# Patient Record
Sex: Female | Born: 1940
Health system: Southern US, Community
[De-identification: ages and names within clinical notes are randomized; demographics above are authoritative.]

## PROBLEM LIST (undated history)

## (undated) DIAGNOSIS — I951 Orthostatic hypotension: Secondary | ICD-10-CM

## (undated) DIAGNOSIS — I10 Essential (primary) hypertension: Secondary | ICD-10-CM

## (undated) DIAGNOSIS — E785 Hyperlipidemia, unspecified: Secondary | ICD-10-CM

## (undated) HISTORY — PX: APPENDECTOMY: SHX54

## (undated) HISTORY — PX: LEG AMPUTATION: SHX1105

## (undated) HISTORY — PX: CHOLECYSTECTOMY: SHX55

## (undated) HISTORY — DX: Orthostatic hypotension: I95.1

## (undated) HISTORY — PX: WISDOM TOOTH EXTRACTION: SHX21

## (undated) HISTORY — DX: Essential (primary) hypertension: I10

## (undated) HISTORY — PX: TONSILLECTOMY: SUR1361

## (undated) HISTORY — DX: Hyperlipidemia, unspecified: E78.5

---

## 2001-10-22 ENCOUNTER — Other Ambulatory Visit: Admission: RE | Admit: 2001-10-22 | Discharge: 2001-10-22 | Payer: Self-pay | Admitting: Family Medicine

## 2004-07-12 ENCOUNTER — Ambulatory Visit: Payer: Self-pay | Admitting: Family Medicine

## 2004-10-24 ENCOUNTER — Ambulatory Visit: Payer: Self-pay | Admitting: Family Medicine

## 2006-04-04 ENCOUNTER — Encounter: Admission: RE | Admit: 2006-04-04 | Discharge: 2006-04-04 | Payer: Self-pay | Admitting: Family Medicine

## 2008-05-22 HISTORY — PX: JOINT REPLACEMENT: SHX530

## 2014-07-01 DIAGNOSIS — F320 Major depressive disorder, single episode, mild: Secondary | ICD-10-CM | POA: Diagnosis not present

## 2014-08-20 DIAGNOSIS — E780 Pure hypercholesterolemia: Secondary | ICD-10-CM | POA: Diagnosis not present

## 2014-08-20 DIAGNOSIS — F320 Major depressive disorder, single episode, mild: Secondary | ICD-10-CM | POA: Diagnosis not present

## 2014-08-20 DIAGNOSIS — I10 Essential (primary) hypertension: Secondary | ICD-10-CM | POA: Diagnosis not present

## 2014-11-20 DIAGNOSIS — I10 Essential (primary) hypertension: Secondary | ICD-10-CM | POA: Diagnosis not present

## 2014-11-20 DIAGNOSIS — E780 Pure hypercholesterolemia: Secondary | ICD-10-CM | POA: Diagnosis not present

## 2014-11-20 DIAGNOSIS — Z79899 Other long term (current) drug therapy: Secondary | ICD-10-CM | POA: Diagnosis not present

## 2015-01-21 DIAGNOSIS — Z23 Encounter for immunization: Secondary | ICD-10-CM | POA: Diagnosis not present

## 2015-01-21 DIAGNOSIS — I10 Essential (primary) hypertension: Secondary | ICD-10-CM | POA: Diagnosis not present

## 2015-01-21 DIAGNOSIS — E780 Pure hypercholesterolemia: Secondary | ICD-10-CM | POA: Diagnosis not present

## 2015-03-02 DIAGNOSIS — Z23 Encounter for immunization: Secondary | ICD-10-CM | POA: Diagnosis not present

## 2015-03-26 DIAGNOSIS — Z1231 Encounter for screening mammogram for malignant neoplasm of breast: Secondary | ICD-10-CM | POA: Diagnosis not present

## 2015-03-30 DIAGNOSIS — H2513 Age-related nuclear cataract, bilateral: Secondary | ICD-10-CM | POA: Diagnosis not present

## 2015-06-17 DIAGNOSIS — R419 Unspecified symptoms and signs involving cognitive functions and awareness: Secondary | ICD-10-CM | POA: Diagnosis not present

## 2015-08-19 DIAGNOSIS — K219 Gastro-esophageal reflux disease without esophagitis: Secondary | ICD-10-CM | POA: Diagnosis not present

## 2015-08-19 DIAGNOSIS — G3184 Mild cognitive impairment, so stated: Secondary | ICD-10-CM | POA: Diagnosis not present

## 2015-08-19 DIAGNOSIS — I10 Essential (primary) hypertension: Secondary | ICD-10-CM | POA: Diagnosis not present

## 2015-08-19 DIAGNOSIS — E7800 Pure hypercholesterolemia, unspecified: Secondary | ICD-10-CM | POA: Diagnosis not present

## 2015-08-19 DIAGNOSIS — Z79899 Other long term (current) drug therapy: Secondary | ICD-10-CM | POA: Diagnosis not present

## 2015-11-25 DIAGNOSIS — F320 Major depressive disorder, single episode, mild: Secondary | ICD-10-CM | POA: Diagnosis not present

## 2015-11-25 DIAGNOSIS — G3184 Mild cognitive impairment, so stated: Secondary | ICD-10-CM | POA: Diagnosis not present

## 2015-11-25 DIAGNOSIS — E7800 Pure hypercholesterolemia, unspecified: Secondary | ICD-10-CM | POA: Diagnosis not present

## 2015-11-25 DIAGNOSIS — I10 Essential (primary) hypertension: Secondary | ICD-10-CM | POA: Diagnosis not present

## 2016-01-12 DIAGNOSIS — S43492A Other sprain of left shoulder joint, initial encounter: Secondary | ICD-10-CM | POA: Diagnosis not present

## 2016-03-09 DIAGNOSIS — Z23 Encounter for immunization: Secondary | ICD-10-CM | POA: Diagnosis not present

## 2016-03-09 DIAGNOSIS — Z0000 Encounter for general adult medical examination without abnormal findings: Secondary | ICD-10-CM | POA: Diagnosis not present

## 2016-03-09 DIAGNOSIS — Z13820 Encounter for screening for osteoporosis: Secondary | ICD-10-CM | POA: Diagnosis not present

## 2016-03-09 DIAGNOSIS — E7800 Pure hypercholesterolemia, unspecified: Secondary | ICD-10-CM | POA: Diagnosis not present

## 2016-03-09 DIAGNOSIS — E669 Obesity, unspecified: Secondary | ICD-10-CM | POA: Diagnosis not present

## 2016-03-09 DIAGNOSIS — Z1239 Encounter for other screening for malignant neoplasm of breast: Secondary | ICD-10-CM | POA: Diagnosis not present

## 2016-03-09 DIAGNOSIS — Z6830 Body mass index (BMI) 30.0-30.9, adult: Secondary | ICD-10-CM | POA: Diagnosis not present

## 2016-03-09 DIAGNOSIS — G3184 Mild cognitive impairment, so stated: Secondary | ICD-10-CM | POA: Diagnosis not present

## 2016-03-09 DIAGNOSIS — I10 Essential (primary) hypertension: Secondary | ICD-10-CM | POA: Diagnosis not present

## 2016-04-10 DIAGNOSIS — M85852 Other specified disorders of bone density and structure, left thigh: Secondary | ICD-10-CM | POA: Diagnosis not present

## 2016-04-10 DIAGNOSIS — Z1231 Encounter for screening mammogram for malignant neoplasm of breast: Secondary | ICD-10-CM | POA: Diagnosis not present

## 2016-04-10 DIAGNOSIS — M859 Disorder of bone density and structure, unspecified: Secondary | ICD-10-CM | POA: Diagnosis not present

## 2016-06-01 DIAGNOSIS — E7800 Pure hypercholesterolemia, unspecified: Secondary | ICD-10-CM | POA: Diagnosis not present

## 2016-06-01 DIAGNOSIS — G3184 Mild cognitive impairment, so stated: Secondary | ICD-10-CM | POA: Diagnosis not present

## 2016-06-01 DIAGNOSIS — I10 Essential (primary) hypertension: Secondary | ICD-10-CM | POA: Diagnosis not present

## 2016-06-01 DIAGNOSIS — Z79899 Other long term (current) drug therapy: Secondary | ICD-10-CM | POA: Diagnosis not present

## 2016-06-01 DIAGNOSIS — M898X0 Other specified disorders of bone, multiple sites: Secondary | ICD-10-CM | POA: Diagnosis not present

## 2016-07-03 DIAGNOSIS — M7542 Impingement syndrome of left shoulder: Secondary | ICD-10-CM | POA: Diagnosis not present

## 2016-07-03 DIAGNOSIS — M25512 Pain in left shoulder: Secondary | ICD-10-CM | POA: Diagnosis not present

## 2016-07-10 DIAGNOSIS — M6281 Muscle weakness (generalized): Secondary | ICD-10-CM | POA: Diagnosis not present

## 2016-07-10 DIAGNOSIS — M25512 Pain in left shoulder: Secondary | ICD-10-CM | POA: Diagnosis not present

## 2016-07-10 DIAGNOSIS — M7542 Impingement syndrome of left shoulder: Secondary | ICD-10-CM | POA: Diagnosis not present

## 2016-07-10 DIAGNOSIS — M25612 Stiffness of left shoulder, not elsewhere classified: Secondary | ICD-10-CM | POA: Diagnosis not present

## 2016-07-13 DIAGNOSIS — M6281 Muscle weakness (generalized): Secondary | ICD-10-CM | POA: Diagnosis not present

## 2016-07-13 DIAGNOSIS — M25512 Pain in left shoulder: Secondary | ICD-10-CM | POA: Diagnosis not present

## 2016-07-13 DIAGNOSIS — M7542 Impingement syndrome of left shoulder: Secondary | ICD-10-CM | POA: Diagnosis not present

## 2016-07-13 DIAGNOSIS — M25612 Stiffness of left shoulder, not elsewhere classified: Secondary | ICD-10-CM | POA: Diagnosis not present

## 2016-07-17 DIAGNOSIS — M25512 Pain in left shoulder: Secondary | ICD-10-CM | POA: Diagnosis not present

## 2016-07-17 DIAGNOSIS — M6281 Muscle weakness (generalized): Secondary | ICD-10-CM | POA: Diagnosis not present

## 2016-07-17 DIAGNOSIS — M7542 Impingement syndrome of left shoulder: Secondary | ICD-10-CM | POA: Diagnosis not present

## 2016-07-17 DIAGNOSIS — M25612 Stiffness of left shoulder, not elsewhere classified: Secondary | ICD-10-CM | POA: Diagnosis not present

## 2016-07-21 DIAGNOSIS — M6281 Muscle weakness (generalized): Secondary | ICD-10-CM | POA: Diagnosis not present

## 2016-07-21 DIAGNOSIS — M25612 Stiffness of left shoulder, not elsewhere classified: Secondary | ICD-10-CM | POA: Diagnosis not present

## 2016-07-21 DIAGNOSIS — M25512 Pain in left shoulder: Secondary | ICD-10-CM | POA: Diagnosis not present

## 2016-07-21 DIAGNOSIS — M7542 Impingement syndrome of left shoulder: Secondary | ICD-10-CM | POA: Diagnosis not present

## 2016-09-05 DIAGNOSIS — H25813 Combined forms of age-related cataract, bilateral: Secondary | ICD-10-CM | POA: Diagnosis not present

## 2016-10-04 DIAGNOSIS — E7800 Pure hypercholesterolemia, unspecified: Secondary | ICD-10-CM | POA: Diagnosis not present

## 2016-10-04 DIAGNOSIS — G3184 Mild cognitive impairment, so stated: Secondary | ICD-10-CM | POA: Diagnosis not present

## 2016-10-04 DIAGNOSIS — I10 Essential (primary) hypertension: Secondary | ICD-10-CM | POA: Diagnosis not present

## 2017-01-11 DIAGNOSIS — E7800 Pure hypercholesterolemia, unspecified: Secondary | ICD-10-CM | POA: Diagnosis not present

## 2017-01-11 DIAGNOSIS — F419 Anxiety disorder, unspecified: Secondary | ICD-10-CM | POA: Diagnosis not present

## 2017-01-11 DIAGNOSIS — I10 Essential (primary) hypertension: Secondary | ICD-10-CM | POA: Diagnosis not present

## 2017-03-05 DIAGNOSIS — M7581 Other shoulder lesions, right shoulder: Secondary | ICD-10-CM | POA: Diagnosis not present

## 2017-04-18 DIAGNOSIS — E7800 Pure hypercholesterolemia, unspecified: Secondary | ICD-10-CM | POA: Diagnosis not present

## 2017-04-18 DIAGNOSIS — E669 Obesity, unspecified: Secondary | ICD-10-CM | POA: Diagnosis not present

## 2017-04-18 DIAGNOSIS — Z800 Family history of malignant neoplasm of digestive organs: Secondary | ICD-10-CM | POA: Diagnosis not present

## 2017-04-18 DIAGNOSIS — Z0000 Encounter for general adult medical examination without abnormal findings: Secondary | ICD-10-CM | POA: Diagnosis not present

## 2017-04-18 DIAGNOSIS — M898X0 Other specified disorders of bone, multiple sites: Secondary | ICD-10-CM | POA: Diagnosis not present

## 2017-04-18 DIAGNOSIS — K219 Gastro-esophageal reflux disease without esophagitis: Secondary | ICD-10-CM | POA: Diagnosis not present

## 2017-04-18 DIAGNOSIS — I10 Essential (primary) hypertension: Secondary | ICD-10-CM | POA: Diagnosis not present

## 2017-04-18 DIAGNOSIS — Z6830 Body mass index (BMI) 30.0-30.9, adult: Secondary | ICD-10-CM | POA: Diagnosis not present

## 2017-04-18 DIAGNOSIS — G3184 Mild cognitive impairment, so stated: Secondary | ICD-10-CM | POA: Diagnosis not present

## 2017-06-04 DIAGNOSIS — R233 Spontaneous ecchymoses: Secondary | ICD-10-CM | POA: Diagnosis not present

## 2017-07-19 DIAGNOSIS — I10 Essential (primary) hypertension: Secondary | ICD-10-CM | POA: Diagnosis not present

## 2017-07-19 DIAGNOSIS — F320 Major depressive disorder, single episode, mild: Secondary | ICD-10-CM | POA: Diagnosis not present

## 2017-07-19 DIAGNOSIS — E7800 Pure hypercholesterolemia, unspecified: Secondary | ICD-10-CM | POA: Diagnosis not present

## 2017-07-19 DIAGNOSIS — G3184 Mild cognitive impairment, so stated: Secondary | ICD-10-CM | POA: Diagnosis not present

## 2017-09-06 DIAGNOSIS — H25813 Combined forms of age-related cataract, bilateral: Secondary | ICD-10-CM | POA: Diagnosis not present

## 2017-09-20 DIAGNOSIS — M25531 Pain in right wrist: Secondary | ICD-10-CM | POA: Diagnosis not present

## 2017-09-20 DIAGNOSIS — S6991XA Unspecified injury of right wrist, hand and finger(s), initial encounter: Secondary | ICD-10-CM | POA: Diagnosis not present

## 2017-09-20 DIAGNOSIS — M7989 Other specified soft tissue disorders: Secondary | ICD-10-CM | POA: Diagnosis not present

## 2017-10-05 DIAGNOSIS — H524 Presbyopia: Secondary | ICD-10-CM | POA: Diagnosis not present

## 2017-10-05 DIAGNOSIS — H52223 Regular astigmatism, bilateral: Secondary | ICD-10-CM | POA: Diagnosis not present

## 2017-10-11 DIAGNOSIS — I10 Essential (primary) hypertension: Secondary | ICD-10-CM | POA: Diagnosis not present

## 2017-10-11 DIAGNOSIS — Z6830 Body mass index (BMI) 30.0-30.9, adult: Secondary | ICD-10-CM | POA: Diagnosis not present

## 2017-10-11 DIAGNOSIS — Z79899 Other long term (current) drug therapy: Secondary | ICD-10-CM | POA: Diagnosis not present

## 2017-10-11 DIAGNOSIS — E669 Obesity, unspecified: Secondary | ICD-10-CM | POA: Diagnosis not present

## 2017-10-11 DIAGNOSIS — M25531 Pain in right wrist: Secondary | ICD-10-CM | POA: Diagnosis not present

## 2017-10-11 DIAGNOSIS — E7800 Pure hypercholesterolemia, unspecified: Secondary | ICD-10-CM | POA: Diagnosis not present

## 2017-10-12 DIAGNOSIS — S52601A Unspecified fracture of lower end of right ulna, initial encounter for closed fracture: Secondary | ICD-10-CM | POA: Diagnosis not present

## 2017-10-12 DIAGNOSIS — S52609A Unspecified fracture of lower end of unspecified ulna, initial encounter for closed fracture: Secondary | ICD-10-CM | POA: Diagnosis not present

## 2017-11-27 DIAGNOSIS — S52601A Unspecified fracture of lower end of right ulna, initial encounter for closed fracture: Secondary | ICD-10-CM | POA: Diagnosis not present

## 2018-03-28 ENCOUNTER — Encounter: Payer: Self-pay | Admitting: Cardiology

## 2018-03-28 ENCOUNTER — Ambulatory Visit (INDEPENDENT_AMBULATORY_CARE_PROVIDER_SITE_OTHER): Payer: Medicare HMO | Admitting: Cardiology

## 2018-03-28 DIAGNOSIS — R0789 Other chest pain: Secondary | ICD-10-CM | POA: Diagnosis not present

## 2018-03-28 HISTORY — DX: Other chest pain: R07.89

## 2018-03-28 NOTE — Progress Notes (Signed)
Cardiology Office Note:    Date:  03/28/2018   ID:  [name], DOB 11-15-1940, MRN [id number]  PCP:  Greig Right, MD  Cardiologist:  Jenean Lindau, MD   Referring MD: No ref. provider found    ASSESSMENT:    1. Chest discomfort    PLAN:    In order of problems listed above:  1. Primary prevention stressed with the patient.  Importance of compliance with diet and medication stressed and she vocalized understanding.  Her blood pressure is stable.  Diet was discussed with dyslipidemia and questions were answered to her satisfaction. 2. In view of risk factors for coronary artery disease I will do an exercise stress echo to assess her symptoms.  If this is a negative test then she will be seen only on a as needed basis.   Medication Adjustments/Labs and Tests Ordered: Current medicines are reviewed at length with the patient today.  Concerns regarding medicines are outlined above.  No orders of the defined types were placed in this encounter.  No orders of the defined types were placed in this encounter.    No chief complaint on file.    History of Present Illness:    Marisa Hall is a 77 y.o. female.  Patient has history of mild dyslipidemia.  She leads a sedentary lifestyle.  Her sister came over and checked her from [place].  Her sister is a physician and evaluated and mention to her that she needs to see heart specialist and therefore she is here.  She leads a sedentary lifestyle.  She does not exercise on a regular basis.  She mentions to me that occasionally when she has exercise she has some chest discomfort.  No features for this symptoms and no radiation to any part of the body.  At the time of my evaluation, the patient is alert awake oriented and in no distress.  History reviewed. No pertinent past medical history.  Past Surgical History:  Procedure Laterality Date  . LEG AMPUTATION    . TONSILLECTOMY    . WISDOM TOOTH EXTRACTION       Current Medications: Current Meds  Medication Sig  . aspirin EC 81 MG tablet Take 81 mg by mouth daily.  . meclizine (ANTIVERT) 25 MG tablet Take 1 tablet by mouth as needed.  . meloxicam (MOBIC) 7.5 MG tablet Take 1 tablet by mouth daily.  . pravastatin (PRAVACHOL) 40 MG tablet Take 1 tablet by mouth daily.     Allergies:   Patient has no known allergies.   Social History   Socioeconomic History  . Marital status: Married    Spouse name: Not on file  . Number of children: Not on file  . Years of education: Not on file  . Highest education level: Not on file  Occupational History  . Not on file  Social Needs  . Financial resource strain: Not on file  . Food insecurity:    Worry: Not on file    Inability: Not on file  . Transportation needs:    Medical: Not on file    Non-medical: Not on file  Tobacco Use  . Smoking status: Never Smoker  . Smokeless tobacco: Never Used  Substance and Sexual Activity  . Alcohol use: Not on file  . Drug use: Not on file  . Sexual activity: Not on file  Lifestyle  . Physical activity:    Days per week: Not on file    Minutes per  session: Not on file  . Stress: Not on file  Relationships  . Social connections:    Talks on phone: Not on file    Gets together: Not on file    Attends religious service: Not on file    Active member of club or organization: Not on file    Attends meetings of clubs or organizations: Not on file    Relationship status: Not on file  Other Topics Concern  . Not on file  Social History Narrative  . Not on file     Family History: The patient's family history includes Hypertension in her father.  ROS:   Please see the history of present illness.    All other systems reviewed and are negative. EKG was unremarkable  EKGs/Labs/Other Studies Reviewed:    The following studies were reviewed today: I discussed my findings with the patient at extensive length.  I am awaiting records from primary care  physician.   Recent Labs: No results found for requested labs within last 8760 hours.  Recent Lipid Panel No results found for: CHOL, TRIG, HDL, CHOLHDL, VLDL, LDLCALC, LDLDIRECT  Physical Exam:    VS:  BP 112/68 (BP Location: Right Arm, Patient Position: Sitting, Cuff Size: Normal)   Pulse 64   Ht 5\' 7"  (1.702 m)   Wt 171 lb (77.6 kg)   SpO2 97%   BMI 26.78 kg/m     Wt Readings from Last 3 Encounters:  03/28/18 171 lb (77.6 kg)     GEN: Patient is in no acute distress HEENT: Normal NECK: No JVD; No carotid bruits LYMPHATICS: No lymphadenopathy CARDIAC: Hear sounds regular, 2/6 systolic murmur at the apex. RESPIRATORY:  Clear to auscultation without rales, wheezing or rhonchi  ABDOMEN: Soft, non-tender, non-distended MUSCULOSKELETAL:  No edema; No deformity  SKIN: Warm and dry NEUROLOGIC:  Alert and oriented x 3 PSYCHIATRIC:  Normal affect   Signed, Jenean Lindau, MD  03/28/2018 3:34 PM    [facility]

## 2018-03-28 NOTE — Patient Instructions (Signed)
Medication Instructions:  Your physician recommends that you continue on your current medications as directed. Please refer to the Current Medication list given to you today.  If you need a refill on your cardiac medications before your next appointment, please call your pharmacy.   Lab work: None  If you have labs (blood work) drawn today and your tests are completely normal, you will receive your results only by: Marland Kitchen MyChart Message (if you have MyChart) OR . A paper copy in the mail If you have any lab test that is abnormal or we need to change your treatment, we will call you to review the results.  Testing/Procedures: Your physician has requested that you have a stress echocardiogram. For further information please visit [email or web address]. Please follow instruction sheet as given.  Follow-Up: At [facility], you and your health needs are our priority.  As part of our continuing mission to provide you with exceptional heart care, we have created designated Provider Care Teams.  These Care Teams include your primary Cardiologist (physician) and Advanced Practice Providers (APPs -  Physician Assistants and Nurse Practitioners) who all work together to provide you with the care you need, when you need it.  You will need a follow up appointment in 6 months.  Please call our office 2 months in advance to schedule this appointment.  You may see another member of our [employer] Provider Team in [place]: Jenne Campus, MD . Shirlee More, MD  Any Other Special Instructions Will Be Listed Below (If Applicable).

## 2018-03-29 NOTE — Addendum Note (Signed)
Addended by: Tarri Glenn on: 03/29/2018 08:06 AM   Modules accepted: Orders

## 2018-04-05 ENCOUNTER — Other Ambulatory Visit: Payer: Medicare HMO

## 2018-04-10 ENCOUNTER — Other Ambulatory Visit: Payer: Medicare HMO

## 2018-04-17 ENCOUNTER — Ambulatory Visit (INDEPENDENT_AMBULATORY_CARE_PROVIDER_SITE_OTHER): Payer: Medicare HMO

## 2018-04-17 DIAGNOSIS — R0789 Other chest pain: Secondary | ICD-10-CM | POA: Diagnosis not present

## 2018-04-17 NOTE — Progress Notes (Signed)
Stress echocardiogram with limited exam has been performed.  Jimmy Kenitra Leventhal RDCS, RVT

## 2018-04-29 ENCOUNTER — Telehealth: Payer: Self-pay | Admitting: Cardiology

## 2018-04-29 NOTE — Telephone Encounter (Signed)
Left voicemail for husband to call the office.

## 2018-04-29 NOTE — Telephone Encounter (Signed)
Please call spouse with results of stress echo.Marland KitchenMarland Kitchen

## 2018-05-02 NOTE — Telephone Encounter (Signed)
Left voicemail for the patient's spouse to call the office.

## 2018-05-03 NOTE — Telephone Encounter (Signed)
Final attempt to reach the patient's spouse was unanswered and unable to leave voicemail.

## 2018-05-13 DIAGNOSIS — Z6831 Body mass index (BMI) 31.0-31.9, adult: Secondary | ICD-10-CM | POA: Diagnosis not present

## 2018-05-13 DIAGNOSIS — I10 Essential (primary) hypertension: Secondary | ICD-10-CM | POA: Diagnosis not present

## 2018-05-13 DIAGNOSIS — Z0000 Encounter for general adult medical examination without abnormal findings: Secondary | ICD-10-CM | POA: Diagnosis not present

## 2018-05-13 DIAGNOSIS — Z79899 Other long term (current) drug therapy: Secondary | ICD-10-CM | POA: Diagnosis not present

## 2018-05-13 DIAGNOSIS — F0390 Unspecified dementia without behavioral disturbance: Secondary | ICD-10-CM | POA: Diagnosis not present

## 2018-05-13 DIAGNOSIS — K219 Gastro-esophageal reflux disease without esophagitis: Secondary | ICD-10-CM | POA: Diagnosis not present

## 2018-05-13 DIAGNOSIS — F325 Major depressive disorder, single episode, in full remission: Secondary | ICD-10-CM | POA: Diagnosis not present

## 2018-05-13 DIAGNOSIS — E669 Obesity, unspecified: Secondary | ICD-10-CM | POA: Diagnosis not present

## 2018-05-13 DIAGNOSIS — E7800 Pure hypercholesterolemia, unspecified: Secondary | ICD-10-CM | POA: Diagnosis not present

## 2018-07-01 DIAGNOSIS — I10 Essential (primary) hypertension: Secondary | ICD-10-CM | POA: Diagnosis not present

## 2018-07-01 DIAGNOSIS — E7800 Pure hypercholesterolemia, unspecified: Secondary | ICD-10-CM | POA: Diagnosis not present

## 2018-07-01 DIAGNOSIS — E669 Obesity, unspecified: Secondary | ICD-10-CM | POA: Diagnosis not present

## 2018-07-01 DIAGNOSIS — Z79899 Other long term (current) drug therapy: Secondary | ICD-10-CM | POA: Diagnosis not present

## 2018-11-21 DIAGNOSIS — H25813 Combined forms of age-related cataract, bilateral: Secondary | ICD-10-CM | POA: Diagnosis not present

## 2019-01-16 DIAGNOSIS — H43811 Vitreous degeneration, right eye: Secondary | ICD-10-CM | POA: Diagnosis not present

## 2019-01-16 DIAGNOSIS — H2513 Age-related nuclear cataract, bilateral: Secondary | ICD-10-CM | POA: Diagnosis not present

## 2019-01-16 DIAGNOSIS — H2512 Age-related nuclear cataract, left eye: Secondary | ICD-10-CM | POA: Diagnosis not present

## 2019-01-16 DIAGNOSIS — H25013 Cortical age-related cataract, bilateral: Secondary | ICD-10-CM | POA: Diagnosis not present

## 2019-02-24 DIAGNOSIS — Z6830 Body mass index (BMI) 30.0-30.9, adult: Secondary | ICD-10-CM | POA: Diagnosis not present

## 2019-02-24 DIAGNOSIS — E669 Obesity, unspecified: Secondary | ICD-10-CM | POA: Diagnosis not present

## 2019-02-24 DIAGNOSIS — I10 Essential (primary) hypertension: Secondary | ICD-10-CM | POA: Diagnosis not present

## 2019-02-24 DIAGNOSIS — Z79899 Other long term (current) drug therapy: Secondary | ICD-10-CM | POA: Diagnosis not present

## 2019-02-24 DIAGNOSIS — F325 Major depressive disorder, single episode, in full remission: Secondary | ICD-10-CM | POA: Diagnosis not present

## 2019-02-24 DIAGNOSIS — E7800 Pure hypercholesterolemia, unspecified: Secondary | ICD-10-CM | POA: Diagnosis not present

## 2019-02-24 DIAGNOSIS — F0390 Unspecified dementia without behavioral disturbance: Secondary | ICD-10-CM | POA: Diagnosis not present

## 2019-02-24 DIAGNOSIS — K219 Gastro-esophageal reflux disease without esophagitis: Secondary | ICD-10-CM | POA: Diagnosis not present

## 2019-02-24 DIAGNOSIS — Z0000 Encounter for general adult medical examination without abnormal findings: Secondary | ICD-10-CM | POA: Diagnosis not present

## 2019-03-11 DIAGNOSIS — H2512 Age-related nuclear cataract, left eye: Secondary | ICD-10-CM | POA: Diagnosis not present

## 2019-03-11 DIAGNOSIS — H25812 Combined forms of age-related cataract, left eye: Secondary | ICD-10-CM | POA: Diagnosis not present

## 2019-04-08 DIAGNOSIS — H25011 Cortical age-related cataract, right eye: Secondary | ICD-10-CM | POA: Diagnosis not present

## 2019-04-08 DIAGNOSIS — H25811 Combined forms of age-related cataract, right eye: Secondary | ICD-10-CM | POA: Diagnosis not present

## 2019-04-08 DIAGNOSIS — H2511 Age-related nuclear cataract, right eye: Secondary | ICD-10-CM | POA: Diagnosis not present

## 2019-05-29 DIAGNOSIS — J439 Emphysema, unspecified: Secondary | ICD-10-CM | POA: Diagnosis not present

## 2019-05-29 DIAGNOSIS — R32 Unspecified urinary incontinence: Secondary | ICD-10-CM | POA: Diagnosis not present

## 2019-05-29 DIAGNOSIS — R4182 Altered mental status, unspecified: Secondary | ICD-10-CM | POA: Diagnosis not present

## 2019-05-29 DIAGNOSIS — R918 Other nonspecific abnormal finding of lung field: Secondary | ICD-10-CM | POA: Diagnosis not present

## 2019-05-29 DIAGNOSIS — Z862 Personal history of diseases of the blood and blood-forming organs and certain disorders involving the immune mechanism: Secondary | ICD-10-CM | POA: Diagnosis not present

## 2019-05-29 DIAGNOSIS — I10 Essential (primary) hypertension: Secondary | ICD-10-CM | POA: Diagnosis not present

## 2019-05-29 DIAGNOSIS — F0390 Unspecified dementia without behavioral disturbance: Secondary | ICD-10-CM | POA: Diagnosis not present

## 2019-05-29 DIAGNOSIS — Z79899 Other long term (current) drug therapy: Secondary | ICD-10-CM | POA: Diagnosis not present

## 2019-05-29 DIAGNOSIS — U071 COVID-19: Secondary | ICD-10-CM | POA: Diagnosis not present

## 2019-05-29 DIAGNOSIS — Z7982 Long term (current) use of aspirin: Secondary | ICD-10-CM | POA: Diagnosis not present

## 2019-07-23 DIAGNOSIS — I10 Essential (primary) hypertension: Secondary | ICD-10-CM | POA: Diagnosis not present

## 2019-07-23 DIAGNOSIS — E7800 Pure hypercholesterolemia, unspecified: Secondary | ICD-10-CM | POA: Diagnosis not present

## 2019-07-23 DIAGNOSIS — E663 Overweight: Secondary | ICD-10-CM | POA: Diagnosis not present

## 2019-07-23 DIAGNOSIS — Z6828 Body mass index (BMI) 28.0-28.9, adult: Secondary | ICD-10-CM | POA: Diagnosis not present

## 2019-07-23 DIAGNOSIS — F325 Major depressive disorder, single episode, in full remission: Secondary | ICD-10-CM | POA: Diagnosis not present

## 2019-07-23 DIAGNOSIS — K219 Gastro-esophageal reflux disease without esophagitis: Secondary | ICD-10-CM | POA: Diagnosis not present

## 2019-07-23 DIAGNOSIS — F0390 Unspecified dementia without behavioral disturbance: Secondary | ICD-10-CM | POA: Diagnosis not present

## 2019-07-30 DIAGNOSIS — R42 Dizziness and giddiness: Secondary | ICD-10-CM | POA: Diagnosis not present

## 2019-08-08 DIAGNOSIS — I951 Orthostatic hypotension: Secondary | ICD-10-CM | POA: Diagnosis not present

## 2019-08-21 DIAGNOSIS — H524 Presbyopia: Secondary | ICD-10-CM | POA: Diagnosis not present

## 2019-08-21 DIAGNOSIS — H5213 Myopia, bilateral: Secondary | ICD-10-CM | POA: Diagnosis not present

## 2019-09-03 DIAGNOSIS — I951 Orthostatic hypotension: Secondary | ICD-10-CM | POA: Diagnosis not present

## 2019-09-08 ENCOUNTER — Encounter: Payer: Self-pay | Admitting: Cardiology

## 2019-09-08 ENCOUNTER — Ambulatory Visit (INDEPENDENT_AMBULATORY_CARE_PROVIDER_SITE_OTHER): Payer: Medicare HMO | Admitting: Cardiology

## 2019-09-08 ENCOUNTER — Other Ambulatory Visit: Payer: Self-pay

## 2019-09-08 VITALS — BP 146/92 | HR 77 | Temp 96.8°F | Ht 64.0 in | Wt 167.0 lb

## 2019-09-08 DIAGNOSIS — I951 Orthostatic hypotension: Secondary | ICD-10-CM

## 2019-09-08 DIAGNOSIS — I77810 Thoracic aortic ectasia: Secondary | ICD-10-CM | POA: Diagnosis not present

## 2019-09-08 DIAGNOSIS — R011 Cardiac murmur, unspecified: Secondary | ICD-10-CM

## 2019-09-08 DIAGNOSIS — I10 Essential (primary) hypertension: Secondary | ICD-10-CM

## 2019-09-08 DIAGNOSIS — R7989 Other specified abnormal findings of blood chemistry: Secondary | ICD-10-CM

## 2019-09-08 DIAGNOSIS — E785 Hyperlipidemia, unspecified: Secondary | ICD-10-CM

## 2019-09-08 MED ORDER — BENAZEPRIL HCL 5 MG PO TABS: 5.0000 mg | ORAL_TABLET | Freq: Every day | ORAL | 3 refills | Status: DC

## 2019-09-08 NOTE — Addendum Note (Signed)
Addended by: Truddie Hidden on: 09/08/2019 03:45 PM   Modules accepted: Orders

## 2019-09-08 NOTE — Progress Notes (Signed)
Cardiology Office Note:    Date:  09/08/2019   ID:  Marisa Hall, DOB 1941/02/16, MRN [id number]  PCP:  Marisa Right, MD  Cardiologist:  Marisa Lindau, MD   Referring MD: Marisa Right, MD    ASSESSMENT:    1. Orthostatic hypotension   2. Hyperlipidemia, unspecified hyperlipidemia type   3. Hypertension, unspecified type    PLAN:    In order of problems listed above:  1. Primary prevention stressed with the patient.  Importance of compliance with diet medication stressed and she vocalized understanding. 2. Essential hypertension: I mentioned to the daughter that I want her to cut her blood pressure medication and to have so that her possibility of getting hypotensive episodes is low.  They understand.  Compression stockings were also emphasized.  In view of the above we will get blood work including Chem-7 CBC and TSH 3. Mixed dyslipidemia: Diet was emphasized.  Patient is on statin and lipids followed by primary care physician. 4. Patient is a poor historian and daughter mentions to me that she has significant dementia.  These were issues in communication and the daughter is very helpful.  Patient's husband is also very supportive and takes good care of her at home. 5. Patient will be seen in follow-up appointment in 2 months or earlier if the patient has any concerns.  Fall precautions were emphasized.    Medication Adjustments/Labs and Tests Ordered: Current medicines are reviewed at length with the patient today.  Concerns regarding medicines are outlined above.  No orders of the defined types were placed in this encounter.  No orders of the defined types were placed in this encounter.    No chief complaint on file.    History of Present Illness:    Marisa Hall is a 79 y.o. female.  Patient has past medical history of essential hypertension and dyslipidemia.  Her daughter accompanies her for this visit.  She mentions that the patient has some dementia.   Patient was not able to tell me her age.  No chest pain orthopnea or PND.  Patient is on blood pressure medications.  Occasionally when she changes posture she feels lightheaded.  No chest pain orthopnea or PND.  At the time of my evaluation, the patient is alert awake oriented and in no distress.  Past Medical History:  Diagnosis Date  . Hyperlipemia   . Hypertension   . Orthostatic hypotension     Past Surgical History:  Procedure Laterality Date  . APPENDECTOMY    . CHOLECYSTECTOMY    . JOINT REPLACEMENT  2010  . LEG AMPUTATION    . TONSILLECTOMY    . WISDOM TOOTH EXTRACTION      Current Medications: Current Meds  Medication Sig  . benazepril (LOTENSIN) 20 MG tablet Take 10 mg by mouth daily.  Marland Kitchen dicyclomine (BENTYL) 10 MG capsule Take 10 mg by mouth 4 (four) times daily as needed. Stomach pain  . meclizine (ANTIVERT) 25 MG tablet Take 1 tablet by mouth as needed.  . meloxicam (MOBIC) 7.5 MG tablet Take 1 tablet by mouth daily.  Marland Kitchen omeprazole (PRILOSEC) 20 MG capsule Take 20 mg by mouth daily.  . pravastatin (PRAVACHOL) 40 MG tablet Take 1 tablet by mouth daily.     Allergies:   Effexor xr [venlafaxine]   Social History   Socioeconomic History  . Marital status: Married    Spouse name: Not on file  . Number of children: Not on file  . Years  of education: Not on file  . Highest education level: Not on file  Occupational History  . Not on file  Tobacco Use  . Smoking status: Never Smoker  . Smokeless tobacco: Never Used  Substance and Sexual Activity  . Alcohol use: Not Currently  . Drug use: Not on file  . Sexual activity: Not on file  Other Topics Concern  . Not on file  Social History Narrative  . Not on file   Social Determinants of Health   Financial Resource Strain:   . Difficulty of Paying Living Expenses:   Food Insecurity:   . Worried About Charity fundraiser in the Last Year:   . Arboriculturist in the Last Year:   Transportation Needs:   . Consulting civil engineer (Medical):   Marland Kitchen Lack of Transportation (Non-Medical):   Physical Activity:   . Days of Exercise per Week:   . Minutes of Exercise per Session:   Stress:   . Feeling of Stress :   Social Connections:   . Frequency of Communication with Friends and Family:   . Frequency of Social Gatherings with Friends and Family:   . Attends Religious Services:   . Active Member of Clubs or Organizations:   . Attends Archivist Meetings:   Marland Kitchen Marital Status:      Family History: The patient's family history includes Colon cancer in her sister; Dementia in her father; Hypertension in her father.  ROS:   Please see the history of present illness.    All other systems reviewed and are negative.  EKGs/Labs/Other Studies Reviewed:    The following studies were reviewed today: EKG reveals sinus rhythm and nonspecific ST-T changes.   Recent Labs: No results found for requested labs within last 8760 hours.  Recent Lipid Panel No results found for: CHOL, TRIG, HDL, CHOLHDL, VLDL, LDLCALC, LDLDIRECT  Physical Exam:    VS:  BP (!) 146/92   Pulse 77   Temp (!) 96.8 F (36 C)   Ht 5\' 4"  (1.626 m)   Wt 167 lb (75.8 kg)   SpO2 98%   BMI 28.67 kg/m     Wt Readings from Last 3 Encounters:  09/08/19 167 lb (75.8 kg)  03/28/18 171 lb (77.6 kg)     GEN: Patient is in no acute distress HEENT: Normal NECK: No JVD; No carotid bruits LYMPHATICS: No lymphadenopathy CARDIAC: Hear sounds regular, 2/6 systolic murmur at the apex. RESPIRATORY:  Clear to auscultation without rales, wheezing or rhonchi  ABDOMEN: Soft, non-tender, non-distended MUSCULOSKELETAL:  No edema; No deformity  SKIN: Warm and dry NEUROLOGIC:  Alert and oriented x 3 PSYCHIATRIC:  Normal affect   Signed, Marisa Lindau, MD  09/08/2019 3:34 PM    [facility]

## 2019-09-08 NOTE — Patient Instructions (Signed)
Medication Instructions:  Your physician has recommended you make the following change in your medication:   Decrease your Benazepril to 5 mg daily.  *If you need a refill on your cardiac medications before your next appointment, please call your pharmacy*   Lab Work: Your physician recommends that you have a BMET, CBC, TSH, and  LFT's checked today in the office. If you have labs (blood work) drawn today and your tests are completely normal, you will receive your results only by: Marland Kitchen MyChart Message (if you have MyChart) OR . A paper copy in the mail If you have any lab test that is abnormal or we need to change your treatment, we will call you to review the results.   Testing/Procedures: Your physician has requested that you have an echocardiogram. Echocardiography is a painless test that uses sound waves to create images of your heart. It provides your doctor with information about the size and shape of your heart and how well your heart's chambers and valves are working. This procedure takes approximately one hour. There are no restrictions for this procedure.     Follow-Up: At [facility], you and your health needs are our priority.  As part of our continuing mission to provide you with exceptional heart care, we have created designated Provider Care Teams.  These Care Teams include your primary Cardiologist (physician) and Advanced Practice Providers (APPs -  Physician Assistants and Nurse Practitioners) who all work together to provide you with the care you need, when you need it.  We recommend signing up for the patient portal called "MyChart".  Sign up information is provided on this After Visit Summary.  MyChart is used to connect with patients for Virtual Visits (Telemedicine).  Patients are able to view lab/test results, encounter notes, upcoming appointments, etc.  Non-urgent messages can be sent to your provider as well.   To learn more about what you can do with MyChart, go  to [email or web address].    Your next appointment:   6 week(s)  The format for your next appointment:   In Person  Provider:   Jyl Heinz, MD   Other Instructions  Echocardiogram An echocardiogram is a procedure that uses painless sound waves (ultrasound) to produce an image of the heart. Images from an echocardiogram can provide important information about:  Signs of coronary artery disease (CAD).  Aneurysm detection. An aneurysm is a weak or damaged part of an artery wall that bulges out from the normal force of blood pumping through the body.  Heart size and shape. Changes in the size or shape of the heart can be associated with certain conditions, including heart failure, aneurysm, and CAD.  Heart muscle function.  Heart valve function.  Signs of a past heart attack.  Fluid buildup around the heart.  Thickening of the heart muscle.  A tumor or infectious growth around the heart valves. Tell a health care provider about:  Any allergies you have.  All medicines you are taking, including vitamins, herbs, eye drops, creams, and over-the-counter medicines.  Any blood disorders you have.  Any surgeries you have had.  Any medical conditions you have.  Whether you are pregnant or may be pregnant. What are the risks? Generally, this is a safe procedure. However, problems may occur, including:  Allergic reaction to dye (contrast) that may be used during the procedure. What happens before the procedure? No specific preparation is needed. You may eat and drink normally. What happens during the procedure?   An  IV tube may be inserted into one of your veins.  You may receive contrast through this tube. A contrast is an injection that improves the quality of the pictures from your heart.  A gel will be applied to your chest.  A wand-like tool (transducer) will be moved over your chest. The gel will help to transmit the sound waves from the transducer.  The  sound waves will harmlessly bounce off of your heart to allow the heart images to be captured in real-time motion. The images will be recorded on a computer. The procedure may vary among health care providers and hospitals. What happens after the procedure?  You may return to your normal, everyday life, including diet, activities, and medicines, unless your health care provider tells you not to do that. Summary  An echocardiogram is a procedure that uses painless sound waves (ultrasound) to produce an image of the heart.  Images from an echocardiogram can provide important information about the size and shape of your heart, heart muscle function, heart valve function, and fluid buildup around your heart.  You do not need to do anything to prepare before this procedure. You may eat and drink normally.  After the echocardiogram is completed, you may return to your normal, everyday life, unless your health care provider tells you not to do that. This information is not intended to replace advice given to you by your health care provider. Make sure you discuss any questions you have with your health care provider. Document Revised: 08/29/2018 Document Reviewed: 06/10/2016 Elsevier Patient Education  [place].

## 2019-09-09 LAB — CBC WITH DIFFERENTIAL/PLATELET
Basophils Absolute: 0.1 10*3/uL (ref 0.0–0.2)
Basos: 1 %
EOS (ABSOLUTE): 0.1 10*3/uL (ref 0.0–0.4)
Eos: 2 %
Hematocrit: 44.1 % (ref 34.0–46.6)
Hemoglobin: 14.7 g/dL (ref 11.1–15.9)
Immature Grans (Abs): 0 10*3/uL (ref 0.0–0.1)
Immature Granulocytes: 0 %
Lymphocytes Absolute: 1.1 10*3/uL (ref 0.7–3.1)
Lymphs: 22 %
MCH: 32.3 pg (ref 26.6–33.0)
MCHC: 33.3 g/dL (ref 31.5–35.7)
MCV: 97 fL (ref 79–97)
Monocytes Absolute: 0.7 10*3/uL (ref 0.1–0.9)
Monocytes: 14 %
Neutrophils Absolute: 3.1 10*3/uL (ref 1.4–7.0)
Neutrophils: 61 %
Platelets: 283 10*3/uL (ref 150–450)
RBC: 4.55 x10E6/uL (ref 3.77–5.28)
RDW: 12 % (ref 11.7–15.4)
WBC: 5.1 10*3/uL (ref 3.4–10.8)

## 2019-09-09 LAB — BASIC METABOLIC PANEL
BUN/Creatinine Ratio: 12 (ref 12–28)
BUN: 15 mg/dL (ref 8–27)
CO2: 24 mmol/L (ref 20–29)
Calcium: 10 mg/dL (ref 8.7–10.3)
Chloride: 103 mmol/L (ref 96–106)
Creatinine, Ser: 1.26 mg/dL — ABNORMAL HIGH (ref 0.57–1.00)
GFR calc Af Amer: 47 mL/min/{1.73_m2} — ABNORMAL LOW (ref >59)
GFR calc non Af Amer: 41 mL/min/{1.73_m2} — ABNORMAL LOW (ref >59)
Glucose: 100 mg/dL — ABNORMAL HIGH (ref 65–99)
Potassium: 4.3 mmol/L (ref 3.5–5.2)
Sodium: 138 mmol/L (ref 134–144)

## 2019-09-09 LAB — HEPATIC FUNCTION PANEL
ALT: 9 IU/L (ref 0–32)
AST: 23 IU/L (ref 0–40)
Albumin: 4.2 g/dL (ref 3.7–4.7)
Alkaline Phosphatase: 72 IU/L (ref 39–117)
Bilirubin Total: 0.5 mg/dL (ref 0.0–1.2)
Bilirubin, Direct: 0.18 mg/dL (ref 0.00–0.40)
Total Protein: 6.2 g/dL (ref 6.0–8.5)

## 2019-09-09 LAB — TSH: TSH: 1.42 u[IU]/mL (ref 0.450–4.500)

## 2019-09-11 ENCOUNTER — Other Ambulatory Visit: Payer: Self-pay

## 2019-09-11 DIAGNOSIS — R7989 Other specified abnormal findings of blood chemistry: Secondary | ICD-10-CM

## 2019-09-11 NOTE — Addendum Note (Signed)
Addended by: Truddie Hidden on: 09/11/2019 03:40 PM   Modules accepted: Orders

## 2019-09-30 ENCOUNTER — Other Ambulatory Visit: Payer: Medicare HMO

## 2019-10-10 DIAGNOSIS — H02402 Unspecified ptosis of left eyelid: Secondary | ICD-10-CM | POA: Diagnosis not present

## 2019-10-15 ENCOUNTER — Ambulatory Visit: Payer: Medicare HMO | Admitting: Cardiology

## 2019-10-17 ENCOUNTER — Ambulatory Visit (INDEPENDENT_AMBULATORY_CARE_PROVIDER_SITE_OTHER): Payer: Medicare HMO

## 2019-10-17 ENCOUNTER — Other Ambulatory Visit: Payer: Self-pay

## 2019-10-17 DIAGNOSIS — R011 Cardiac murmur, unspecified: Secondary | ICD-10-CM

## 2019-10-17 NOTE — Progress Notes (Signed)
Complete echocardiogram performed.  Jimmy Acelin Ferdig RDCS, RVT  

## 2019-10-21 NOTE — Addendum Note (Signed)
Addended by: Truddie Hidden on: 10/21/2019 11:13 AM   Modules accepted: Orders

## 2019-10-27 DIAGNOSIS — R2689 Other abnormalities of gait and mobility: Secondary | ICD-10-CM | POA: Diagnosis not present

## 2019-10-27 DIAGNOSIS — H02402 Unspecified ptosis of left eyelid: Secondary | ICD-10-CM | POA: Diagnosis not present

## 2019-10-28 ENCOUNTER — Other Ambulatory Visit: Payer: Self-pay

## 2019-10-28 DIAGNOSIS — H02831 Dermatochalasis of right upper eyelid: Secondary | ICD-10-CM | POA: Diagnosis not present

## 2019-10-28 DIAGNOSIS — H02413 Mechanical ptosis of bilateral eyelids: Secondary | ICD-10-CM | POA: Diagnosis not present

## 2019-10-28 DIAGNOSIS — H02834 Dermatochalasis of left upper eyelid: Secondary | ICD-10-CM | POA: Diagnosis not present

## 2019-10-28 DIAGNOSIS — H0279 Other degenerative disorders of eyelid and periocular area: Secondary | ICD-10-CM | POA: Diagnosis not present

## 2019-10-28 DIAGNOSIS — H02423 Myogenic ptosis of bilateral eyelids: Secondary | ICD-10-CM | POA: Diagnosis not present

## 2019-10-28 DIAGNOSIS — H57813 Brow ptosis, bilateral: Secondary | ICD-10-CM | POA: Diagnosis not present

## 2019-10-28 DIAGNOSIS — H53483 Generalized contraction of visual field, bilateral: Secondary | ICD-10-CM | POA: Diagnosis not present

## 2019-10-31 ENCOUNTER — Ambulatory Visit: Payer: Medicare HMO | Admitting: Cardiology

## 2019-11-03 ENCOUNTER — Telehealth: Payer: Self-pay | Admitting: Cardiology

## 2019-11-03 DIAGNOSIS — H53481 Generalized contraction of visual field, right eye: Secondary | ICD-10-CM | POA: Diagnosis not present

## 2019-11-03 DIAGNOSIS — H53482 Generalized contraction of visual field, left eye: Secondary | ICD-10-CM | POA: Diagnosis not present

## 2019-11-03 DIAGNOSIS — H53483 Generalized contraction of visual field, bilateral: Secondary | ICD-10-CM | POA: Diagnosis not present

## 2019-11-03 NOTE — Telephone Encounter (Signed)
Patient is having a Ct done on Wednesday 6/16 there and they need a prior auth, Dr. Geraldo Pitter ordered it.

## 2019-11-04 NOTE — Telephone Encounter (Signed)
Follow up  Marisa Hall from [facility] call, she said CT for pt need authorization, she gave tax ID [id number]. She said pt is scheduled tomorrow 11/05/19 at 4:30 pm.

## 2019-11-04 NOTE — Telephone Encounter (Signed)
Spoke with Abigail Butts and CT form re-faxed with new code.

## 2019-11-05 DIAGNOSIS — I2510 Atherosclerotic heart disease of native coronary artery without angina pectoris: Secondary | ICD-10-CM | POA: Diagnosis not present

## 2019-11-05 DIAGNOSIS — I77810 Thoracic aortic ectasia: Secondary | ICD-10-CM | POA: Diagnosis not present

## 2019-11-05 DIAGNOSIS — K449 Diaphragmatic hernia without obstruction or gangrene: Secondary | ICD-10-CM | POA: Diagnosis not present

## 2019-11-05 DIAGNOSIS — I700 Atherosclerosis of aorta: Secondary | ICD-10-CM | POA: Diagnosis not present

## 2019-11-05 DIAGNOSIS — R931 Abnormal findings on diagnostic imaging of heart and coronary circulation: Secondary | ICD-10-CM | POA: Diagnosis not present

## 2019-12-29 DIAGNOSIS — H02834 Dermatochalasis of left upper eyelid: Secondary | ICD-10-CM | POA: Diagnosis not present

## 2019-12-29 DIAGNOSIS — H0279 Other degenerative disorders of eyelid and periocular area: Secondary | ICD-10-CM | POA: Diagnosis not present

## 2019-12-29 DIAGNOSIS — H02423 Myogenic ptosis of bilateral eyelids: Secondary | ICD-10-CM | POA: Diagnosis not present

## 2019-12-29 DIAGNOSIS — H53483 Generalized contraction of visual field, bilateral: Secondary | ICD-10-CM | POA: Diagnosis not present

## 2019-12-29 DIAGNOSIS — H02413 Mechanical ptosis of bilateral eyelids: Secondary | ICD-10-CM | POA: Diagnosis not present

## 2019-12-29 DIAGNOSIS — H57813 Brow ptosis, bilateral: Secondary | ICD-10-CM | POA: Diagnosis not present

## 2019-12-29 DIAGNOSIS — H02831 Dermatochalasis of right upper eyelid: Secondary | ICD-10-CM | POA: Diagnosis not present

## 2020-02-02 DIAGNOSIS — H0279 Other degenerative disorders of eyelid and periocular area: Secondary | ICD-10-CM | POA: Diagnosis not present

## 2020-02-02 DIAGNOSIS — H53483 Generalized contraction of visual field, bilateral: Secondary | ICD-10-CM | POA: Diagnosis not present

## 2020-02-02 DIAGNOSIS — H02831 Dermatochalasis of right upper eyelid: Secondary | ICD-10-CM | POA: Diagnosis not present

## 2020-02-02 DIAGNOSIS — H02834 Dermatochalasis of left upper eyelid: Secondary | ICD-10-CM | POA: Diagnosis not present

## 2020-02-02 DIAGNOSIS — H57813 Brow ptosis, bilateral: Secondary | ICD-10-CM | POA: Diagnosis not present

## 2020-02-02 DIAGNOSIS — H02413 Mechanical ptosis of bilateral eyelids: Secondary | ICD-10-CM | POA: Diagnosis not present

## 2020-02-02 DIAGNOSIS — H02423 Myogenic ptosis of bilateral eyelids: Secondary | ICD-10-CM | POA: Diagnosis not present

## 2020-02-11 DIAGNOSIS — I10 Essential (primary) hypertension: Secondary | ICD-10-CM | POA: Diagnosis not present

## 2020-02-11 DIAGNOSIS — Z23 Encounter for immunization: Secondary | ICD-10-CM | POA: Diagnosis not present

## 2020-02-11 DIAGNOSIS — E7800 Pure hypercholesterolemia, unspecified: Secondary | ICD-10-CM | POA: Diagnosis not present

## 2020-02-11 DIAGNOSIS — F0390 Unspecified dementia without behavioral disturbance: Secondary | ICD-10-CM | POA: Diagnosis not present

## 2020-02-11 DIAGNOSIS — Z6827 Body mass index (BMI) 27.0-27.9, adult: Secondary | ICD-10-CM | POA: Diagnosis not present

## 2020-03-01 ENCOUNTER — Inpatient Hospital Stay (HOSPITAL_COMMUNITY)
Admission: EM | Admit: 2020-03-01 | Discharge: 2020-03-04 | DRG: 312 | Disposition: A | Discharge status: Home Health | Payer: Medicare HMO | Attending: Internal Medicine | Admitting: Internal Medicine

## 2020-03-01 ENCOUNTER — Encounter (HOSPITAL_COMMUNITY): Payer: Self-pay | Admitting: Emergency Medicine

## 2020-03-01 ENCOUNTER — Emergency Department (HOSPITAL_COMMUNITY): Payer: Medicare HMO

## 2020-03-01 DIAGNOSIS — W19XXXA Unspecified fall, initial encounter: Secondary | ICD-10-CM | POA: Diagnosis not present

## 2020-03-01 DIAGNOSIS — I6623 Occlusion and stenosis of bilateral posterior cerebral arteries: Secondary | ICD-10-CM | POA: Diagnosis not present

## 2020-03-01 DIAGNOSIS — G459 Transient cerebral ischemic attack, unspecified: Secondary | ICD-10-CM

## 2020-03-01 DIAGNOSIS — H5461 Unqualified visual loss, right eye, normal vision left eye: Secondary | ICD-10-CM | POA: Diagnosis present

## 2020-03-01 DIAGNOSIS — I951 Orthostatic hypotension: Secondary | ICD-10-CM | POA: Diagnosis not present

## 2020-03-01 DIAGNOSIS — Z800 Family history of malignant neoplasm of digestive organs: Secondary | ICD-10-CM

## 2020-03-01 DIAGNOSIS — I10 Essential (primary) hypertension: Secondary | ICD-10-CM | POA: Diagnosis present

## 2020-03-01 DIAGNOSIS — R55 Syncope and collapse: Secondary | ICD-10-CM | POA: Diagnosis present

## 2020-03-01 DIAGNOSIS — Z79899 Other long term (current) drug therapy: Secondary | ICD-10-CM

## 2020-03-01 DIAGNOSIS — F0390 Unspecified dementia without behavioral disturbance: Secondary | ICD-10-CM | POA: Diagnosis present

## 2020-03-01 DIAGNOSIS — R29703 NIHSS score 3: Secondary | ICD-10-CM | POA: Diagnosis present

## 2020-03-01 DIAGNOSIS — I77810 Thoracic aortic ectasia: Secondary | ICD-10-CM | POA: Diagnosis not present

## 2020-03-01 DIAGNOSIS — Z66 Do not resuscitate: Secondary | ICD-10-CM | POA: Diagnosis present

## 2020-03-01 DIAGNOSIS — Z9049 Acquired absence of other specified parts of digestive tract: Secondary | ICD-10-CM | POA: Diagnosis not present

## 2020-03-01 DIAGNOSIS — R29818 Other symptoms and signs involving the nervous system: Secondary | ICD-10-CM | POA: Diagnosis not present

## 2020-03-01 DIAGNOSIS — I6622 Occlusion and stenosis of left posterior cerebral artery: Secondary | ICD-10-CM | POA: Diagnosis present

## 2020-03-01 DIAGNOSIS — R4781 Slurred speech: Secondary | ICD-10-CM | POA: Diagnosis not present

## 2020-03-01 DIAGNOSIS — E785 Hyperlipidemia, unspecified: Secondary | ICD-10-CM | POA: Diagnosis present

## 2020-03-01 DIAGNOSIS — R4182 Altered mental status, unspecified: Secondary | ICD-10-CM | POA: Diagnosis present

## 2020-03-01 DIAGNOSIS — I672 Cerebral atherosclerosis: Secondary | ICD-10-CM | POA: Diagnosis present

## 2020-03-01 DIAGNOSIS — R0902 Hypoxemia: Secondary | ICD-10-CM | POA: Diagnosis not present

## 2020-03-01 DIAGNOSIS — Z8249 Family history of ischemic heart disease and other diseases of the circulatory system: Secondary | ICD-10-CM | POA: Diagnosis not present

## 2020-03-01 DIAGNOSIS — I639 Cerebral infarction, unspecified: Secondary | ICD-10-CM

## 2020-03-01 DIAGNOSIS — Y92009 Unspecified place in unspecified non-institutional (private) residence as the place of occurrence of the external cause: Secondary | ICD-10-CM | POA: Diagnosis not present

## 2020-03-01 DIAGNOSIS — Z888 Allergy status to other drugs, medicaments and biological substances status: Secondary | ICD-10-CM

## 2020-03-01 DIAGNOSIS — Z20822 Contact with and (suspected) exposure to covid-19: Secondary | ICD-10-CM | POA: Diagnosis present

## 2020-03-01 DIAGNOSIS — R531 Weakness: Secondary | ICD-10-CM | POA: Diagnosis not present

## 2020-03-01 DIAGNOSIS — Z043 Encounter for examination and observation following other accident: Secondary | ICD-10-CM | POA: Diagnosis not present

## 2020-03-01 DIAGNOSIS — I6523 Occlusion and stenosis of bilateral carotid arteries: Secondary | ICD-10-CM | POA: Diagnosis not present

## 2020-03-01 HISTORY — DX: Syncope and collapse: R55

## 2020-03-01 LAB — COMPREHENSIVE METABOLIC PANEL
ALT: 19 U/L (ref 0–44)
AST: 32 U/L (ref 15–41)
Albumin: 3.5 g/dL (ref 3.5–5.0)
Alkaline Phosphatase: 60 U/L (ref 38–126)
Anion gap: 8 (ref 5–15)
BUN: 21 mg/dL (ref 8–23)
CO2: 25 mmol/L (ref 22–32)
Calcium: 9.5 mg/dL (ref 8.9–10.3)
Chloride: 105 mmol/L (ref 98–111)
Creatinine, Ser: 1.33 mg/dL — ABNORMAL HIGH (ref 0.44–1.00)
GFR, Estimated: 38 mL/min — ABNORMAL LOW (ref >60)
Glucose, Bld: 120 mg/dL — ABNORMAL HIGH (ref 70–99)
Potassium: 4.2 mmol/L (ref 3.5–5.1)
Sodium: 138 mmol/L (ref 135–145)
Total Bilirubin: 0.7 mg/dL (ref 0.3–1.2)
Total Protein: 5.8 g/dL — ABNORMAL LOW (ref 6.5–8.1)

## 2020-03-01 LAB — DIFFERENTIAL
Abs Immature Granulocytes: 0.02 10*3/uL (ref 0.00–0.07)
Basophils Absolute: 0 10*3/uL (ref 0.0–0.1)
Basophils Relative: 1 %
Eosinophils Absolute: 0.1 10*3/uL (ref 0.0–0.5)
Eosinophils Relative: 1 %
Immature Granulocytes: 0 %
Lymphocytes Relative: 14 %
Lymphs Abs: 0.8 10*3/uL (ref 0.7–4.0)
Monocytes Absolute: 0.6 10*3/uL (ref 0.1–1.0)
Monocytes Relative: 10 %
Neutro Abs: 4.3 10*3/uL (ref 1.7–7.7)
Neutrophils Relative %: 74 %

## 2020-03-01 LAB — URINALYSIS, ROUTINE W REFLEX MICROSCOPIC
Bilirubin Urine: NEGATIVE
Glucose, UA: NEGATIVE mg/dL
Hgb urine dipstick: NEGATIVE
Ketones, ur: 5 mg/dL — AB
Leukocytes,Ua: NEGATIVE
Nitrite: NEGATIVE
Protein, ur: NEGATIVE mg/dL
Specific Gravity, Urine: >1.046 — ABNORMAL HIGH (ref 1.005–1.030)
pH: 5 (ref 5.0–8.0)

## 2020-03-01 LAB — RAPID URINE DRUG SCREEN, HOSP PERFORMED
Amphetamines: NOT DETECTED
Barbiturates: NOT DETECTED
Benzodiazepines: NOT DETECTED
Cocaine: NOT DETECTED
Opiates: POSITIVE — AB
Tetrahydrocannabinol: NOT DETECTED

## 2020-03-01 LAB — CBG MONITORING, ED: Glucose-Capillary: 114 mg/dL — ABNORMAL HIGH (ref 70–99)

## 2020-03-01 LAB — RESPIRATORY PANEL BY RT PCR (FLU A&B, COVID)
Influenza A by PCR: NEGATIVE
Influenza B by PCR: NEGATIVE
SARS Coronavirus 2 by RT PCR: NEGATIVE

## 2020-03-01 LAB — CBC
HCT: 43.1 % (ref 36.0–46.0)
Hemoglobin: 13.6 g/dL (ref 12.0–15.0)
MCH: 31.2 pg (ref 26.0–34.0)
MCHC: 31.6 g/dL (ref 30.0–36.0)
MCV: 98.9 fL (ref 80.0–100.0)
Platelets: 208 10*3/uL (ref 150–400)
RBC: 4.36 MIL/uL (ref 3.87–5.11)
RDW: 12.7 % (ref 11.5–15.5)
WBC: 5.9 10*3/uL (ref 4.0–10.5)
nRBC: 0 % (ref 0.0–0.2)

## 2020-03-01 LAB — PROTIME-INR
INR: 1.1 (ref 0.8–1.2)
Prothrombin Time: 13.9 seconds (ref 11.4–15.2)

## 2020-03-01 LAB — ETHANOL: Alcohol, Ethyl (B): <10 mg/dL (ref <10)

## 2020-03-01 LAB — APTT: aPTT: 27 seconds (ref 24–36)

## 2020-03-01 MED ORDER — SODIUM CHLORIDE 0.9% FLUSH
3.0000 mL | Freq: Two times a day (BID) | INTRAVENOUS | Status: DC
  Administered 2020-03-01 – 2020-03-04 (×4): 3 mL via INTRAVENOUS

## 2020-03-01 MED ORDER — MEMANTINE HCL 10 MG PO TABS
10.0000 mg | ORAL_TABLET | Freq: Two times a day (BID) | ORAL | Status: DC
  Administered 2020-03-01 – 2020-03-04 (×6): 10 mg via ORAL
  Filled 2020-03-01 (×9): qty 1

## 2020-03-01 MED ORDER — PANTOPRAZOLE SODIUM 40 MG PO TBEC
40.0000 mg | DELAYED_RELEASE_TABLET | Freq: Every day | ORAL | Status: DC
  Administered 2020-03-02 – 2020-03-04 (×3): 40 mg via ORAL
  Filled 2020-03-01 (×3): qty 1

## 2020-03-01 MED ORDER — DULOXETINE HCL 60 MG PO CPEP
60.0000 mg | ORAL_CAPSULE | Freq: Every day | ORAL | Status: DC
  Administered 2020-03-02 – 2020-03-04 (×3): 60 mg via ORAL
  Filled 2020-03-01 (×3): qty 1

## 2020-03-01 MED ORDER — ACETAMINOPHEN 650 MG RE SUPP: 650.0000 mg | Freq: Four times a day (QID) | RECTAL | Status: DC | PRN

## 2020-03-01 MED ORDER — ACETAMINOPHEN 325 MG PO TABS: 650.0000 mg | ORAL_TABLET | Freq: Four times a day (QID) | ORAL | Status: DC | PRN

## 2020-03-01 MED ORDER — VITAMIN D 25 MCG (1000 UNIT) PO TABS
1000.0000 [IU] | ORAL_TABLET | Freq: Every day | ORAL | Status: DC
  Administered 2020-03-02 – 2020-03-04 (×3): 1000 [IU] via ORAL
  Filled 2020-03-01 (×3): qty 1

## 2020-03-01 MED ORDER — IOHEXOL 350 MG/ML SOLN
100.0000 mL | Freq: Once | INTRAVENOUS | Status: AC | PRN
  Administered 2020-03-01: 100 mL via INTRAVENOUS

## 2020-03-01 MED ORDER — DONEPEZIL HCL 10 MG PO TABS
10.0000 mg | ORAL_TABLET | Freq: Every day | ORAL | Status: DC
  Administered 2020-03-01 – 2020-03-03 (×3): 10 mg via ORAL
  Filled 2020-03-01 (×4): qty 1

## 2020-03-01 MED ORDER — DICYCLOMINE HCL 10 MG PO CAPS: 10.0000 mg | ORAL_CAPSULE | Freq: Four times a day (QID) | ORAL | Status: DC | PRN

## 2020-03-01 MED ORDER — ENOXAPARIN SODIUM 40 MG/0.4ML ~~LOC~~ SOLN
40.0000 mg | SUBCUTANEOUS | Status: DC
  Administered 2020-03-01 – 2020-03-03 (×3): 40 mg via SUBCUTANEOUS
  Filled 2020-03-01 (×3): qty 0.4

## 2020-03-01 MED ORDER — BUSPIRONE HCL 5 MG PO TABS
7.5000 mg | ORAL_TABLET | Freq: Every day | ORAL | Status: DC
  Administered 2020-03-02 – 2020-03-04 (×3): 7.5 mg via ORAL
  Filled 2020-03-01: qty 2
  Filled 2020-03-01: qty 1
  Filled 2020-03-01: qty 2

## 2020-03-01 MED ORDER — VITAMIN B-12 100 MCG PO TABS
100.0000 ug | ORAL_TABLET | Freq: Every day | ORAL | Status: DC
  Administered 2020-03-02 – 2020-03-04 (×3): 100 ug via ORAL
  Filled 2020-03-01 (×3): qty 1

## 2020-03-01 NOTE — H&P (Signed)
History and Physical   Marisa Hall:[id number] DOB: 08/04/1940 DOA: 03/01/2020  PCP: Greig Right, MD   Patient coming from: Home  Chief Complaint: Syncope, fall, slurred speech  HPI: Marisa Hall is a 79 y.o. female with medical history significant of dementia, hypertension, orthostatic hypotension, hyperlipidemia who presents after a fall at home.  Patient has had episodes of orthostatic hypotension ongoing since April (her blood pressure medication was reduced at that time).  She did have an evaluation which included a normal TSH in April and an echo in May for this.  She has had several episodes that mostly consisted of feeling lightheaded or dizzy upon standing for the last several months.  Today she had a more significant episode where she was lightheaded took a rest and instead of becoming lightheaded again and then had a fall.  Family notices some eye fluttering, but no shaking.  When seen by EMS patient was noted to have poor memory (consistent with her dementia), slurred speech, and questionable right-sided weakness.  She initially arrived as a code stroke and was seen by neurology in the ED, but CT and MRI were negative for stroke.  Is feeling back to her baseline at this time.  She denies fevers, cough, chest pain, shortness of breath, abdominal pain, constipation, diarrhea, nausea.    ED Course: Vital signs stable, labs showed creatinine 1.3 (consistent with baseline).  EKG showed sinus rhythm at 80 bpm.  CT head was negative for acute abnormality CTA head and neck with perfusion study was negative for large vessel occlusion but did show some intracranial greater than extracranial atherosclerosis with areas of severe stenosis in the left PCA at the P3 segment.  Also noted was a 3.7 cm soft tissue mass that meets criteria for follow-up thyroid ultrasound.  Review of Systems: As per HPI otherwise all other systems reviewed and are negative.   Past Medical History:    Diagnosis Date  . Hyperlipemia   . Hypertension   . Orthostatic hypotension     Past Surgical History:  Procedure Laterality Date  . APPENDECTOMY    . CHOLECYSTECTOMY    . JOINT REPLACEMENT  2010  . LEG AMPUTATION    . TONSILLECTOMY    . WISDOM TOOTH EXTRACTION      Social History  reports that she has never smoked. She has never used smokeless tobacco. She reports previous alcohol use. No history on file for drug use.  Allergies  Allergen Reactions  . Effexor Xr [Venlafaxine]     Family History  Problem Relation Age of Onset  . Hypertension Father   . Dementia Father   . Colon cancer Sister   Reviewed on admission  Prior to Admission medications   Medication Sig Start Date End Date Taking? Authorizing Provider  benazepril (LOTENSIN) 5 MG tablet Take 1 tablet (5 mg total) by mouth daily. 09/08/19  Yes Revankar, Reita Cliche, MD  busPIRone (BUSPAR) 7.5 MG tablet Take 1 tablet by mouth daily. 10/12/19  Yes [provider]  cholecalciferol (VITAMIN D3) 25 MCG (1000 UNIT) tablet Take 1,000 Units by mouth daily.   Yes [provider]  dicyclomine (BENTYL) 10 MG capsule Take 10 mg by mouth 4 (four) times daily as needed for spasms. Stomach pain   Yes [provider]  donepezil (ARICEPT) 10 MG tablet Take 1 tablet by mouth at bedtime.  08/17/19  Yes [provider]  DULoxetine (CYMBALTA) 60 MG capsule Take 60 mg by mouth daily. 11/15/19  Yes [provider]  meclizine (ANTIVERT) 25 MG tablet Take 1 tablet by mouth 2 (two) times daily as needed for dizziness.  03/22/18  Yes [provider]  memantine (NAMENDA) 10 MG tablet Take 1 tablet by mouth 2 (two) times daily.  09/25/19  Yes [provider]  omeprazole (PRILOSEC) 20 MG capsule Take 20 mg by mouth daily.   Yes [provider]  vitamin B-12 (CYANOCOBALAMIN) 100 MCG tablet Take 100 mcg by mouth daily.   Yes [provider]    Physical Exam: Vitals:    03/01/20 1815 03/01/20 1830 03/01/20 1845 03/01/20 1900  BP:  (!) 157/83 (!) 163/103 (!) 153/99  Pulse: 62 65 60 62  Resp: 14 13 11 18   Temp:      TempSrc:      SpO2: 100% 100% 99% 98%  Weight:      Height:        Physical Exam Constitutional:      General: She is not in acute distress.    Appearance: Normal appearance.     Comments: Pleasantly demented  HENT:     Head: Normocephalic and atraumatic.     Mouth/Throat:     Mouth: Mucous membranes are moist.     Pharynx: Oropharynx is clear.  Eyes:     Extraocular Movements: Extraocular movements intact.     Pupils: Pupils are equal, round, and reactive to light.  Cardiovascular:     Rate and Rhythm: Normal rate and regular rhythm.     Pulses: Normal pulses.     Heart sounds: Normal heart sounds.  Pulmonary:     Effort: Pulmonary effort is normal. No respiratory distress.     Breath sounds: Normal breath sounds.  Abdominal:     General: Bowel sounds are normal. There is no distension.     Palpations: Abdomen is soft.     Tenderness: There is no abdominal tenderness.  Musculoskeletal:        General: No swelling or deformity.  Skin:    General: Skin is warm and dry.  Neurological:     General: No focal deficit present.     Mental Status: Mental status is at baseline.    Labs on Admission: I have personally reviewed following labs and imaging studies  CBC: Recent Labs  Lab 03/01/20 1654  WBC 5.9  NEUTROABS 4.3  HGB 13.6  HCT 43.1  MCV 98.9  PLT 322    Basic Metabolic Panel: Recent Labs  Lab 03/01/20 1654  NA 138  K 4.2  CL 105  CO2 25  GLUCOSE 120*  BUN 21  CREATININE 1.33*  CALCIUM 9.5    GFR: Estimated Creatinine Clearance: 34.4 mL/min (A) (by C-G formula based on SCr of 1.33 mg/dL (H)).  Liver Function Tests: Recent Labs  Lab 03/01/20 1654  AST 32  ALT 19  ALKPHOS 60  BILITOT 0.7  PROT 5.8*  ALBUMIN 3.5    Urine analysis: No results found for: COLORURINE, APPEARANCEUR, LABSPEC,  PHURINE, GLUCOSEU, HGBUR, BILIRUBINUR, KETONESUR, PROTEINUR, UROBILINOGEN, NITRITE, LEUKOCYTESUR  Radiological Exams on Admission: MR BRAIN WO CONTRAST  Result Date: 03/01/2020 CLINICAL DATA:  79 year old female with dementia. Right side visual defect. Code stroke presentation. Intracranial atherosclerosis on CTA. EXAM: MRI HEAD WITHOUT CONTRAST LIMITED TECHNIQUE: Multiplanar, multiecho pulse sequences of the brain and surrounding structures were obtained without intravenous contrast. COMPARISON:  CT head, CTA and CTP the same day. FINDINGS: Study was limited by neurology request to DWI and SWI. No restricted  diffusion or evidence of acute infarction. No midline shift, mass effect, or evidence of intracranial mass lesion. No ventriculomegaly. On SWI no evidence of acute or chronic cerebral hemorrhage is identified. There is evidence of a right cerebellar developmental venous anomaly (normal variant - series 4, image 22). IMPRESSION: No evidence of acute infarct. Study discussed by telephone with Dr. Donnetta Simpers on 03/01/2020 at 1750 hours. Electronically Signed   By: Genevie Ann M.D.   On: 03/01/2020 18:03   CT CEREBRAL PERFUSION W CONTRAST  Result Date: 03/01/2020 CLINICAL DATA:  79 year old code stroke presentation, right side visual deficit. Reportedly no weakness or aphasia at this time. EXAM: CT ANGIOGRAPHY HEAD AND NECK CT PERFUSION BRAIN TECHNIQUE: Multidetector CT imaging of the head and neck was performed using the standard protocol during bolus administration of intravenous contrast. Multiplanar CT image reconstructions and MIPs were obtained to evaluate the vascular anatomy. Carotid stenosis measurements (when applicable) are obtained utilizing NASCET criteria, using the distal internal carotid diameter as the denominator. Multiphase CT imaging of the brain was performed following IV bolus contrast injection. Subsequent parametric perfusion maps were calculated using RAPID software. CONTRAST:   189mL OMNIPAQUE IOHEXOL 350 MG/ML SOLN COMPARISON:  Plain head CT 1708 hours today. FINDINGS: CT Brain Perfusion Findings: ASPECTS: 10 CBF (<30%) Volume: NonemL Perfusion (Tmax>6.0s) volume: 49mL, inferior left temporal lobe favored to be artifact. Mismatch Volume: 17mL, favored to be artifact. Infarction Location:No core infarct detected CTA NECK Skeleton: Absent mandible dentition. Degenerative changes in the cervical and upper thoracic spine, including degenerative appearing spondylolisthesis at C7-T1 and T1-T2. No acute osseous abnormality identified. Upper chest: Negative. Other neck: At the right thoracic inlet there is an oval heterogeneously hypodense soft tissue mass measuring up to 3.7 cm long axis (series 8, image 134 and series 6, image 139) inseparable from the right tracheoesophageal groove and also the lower pole of the right thyroid where there is evidence of a "claw sign" (series 6, image 136). Burtis Junes this is an exophytic thyroid nodule. No superimposed generalized thoracic inlet lymphadenopathy. Other neck soft tissue contours are within normal limits. Aortic arch: 3 vessel arch configuration. Mild to moderate soft and calcified arch atherosclerosis. Right carotid system: No brachiocephalic or right CCA origin stenosis despite mild plaque. Mild plaque also at the right bifurcation. No cervical right ICA stenosis. Tortuous vessel distal to the bulb, and mildly beaded appearance of the distal cervical ICA (series 11, image 18). Left carotid system: Mild left CCA plaque without stenosis. Mild to moderate soft plaque at the left ICA origin with less than 50 % stenosis with respect to the distal vessel (series 10, image 131). Tortuous vessel with a mildly kinked appearance at the distal bulb. Mildly beaded appearance of the left ICA distal to the bulb also similar to the right side. Vertebral arteries: Mild plaque in the proximal right subclavian artery without stenosis. Normal right vertebral artery  origin. Non dominant right vertebral artery is patent to the skull base without significant plaque or stenosis. Soft and calcified plaque in the proximal left subclavian artery without stenosis. Normal left vertebral artery origin. Dominant left vertebral with a tortuous V1 segment. Patent left vertebral to the skull base without significant plaque or stenosis. CTA HEAD Posterior circulation: The non dominant right vertebral is diminutive beyond the patent right PICA origin. Normal left PICA origin. There is bilateral distal vertebral plaque but no significant stenosis. Tortuous vertebrobasilar junction is patent without stenosis. Patent, tortuous basilar artery. Patent SCA and PCA origins.  Posterior communicating arteries are diminutive or absent. Mild bilateral P1 segment irregularity. Mild to moderate right P2 but severe left P3 segment stenosis (series 11, image 27). Although there is no discrete PCA branch occlusion. The distal PCA branches appear fairly symmetric, irregular. Anterior circulation: Both ICA siphons are patent. Mild to moderate bilateral siphon calcified plaque, greater on the left with no hemodynamically significant stenosis. Both ophthalmic artery origins are patent and appear normal. Patent carotid termini. Normal MCA and ACA origins. Anterior communicating artery is normal. Both PCAs are mildly tortuous and irregular. But patent without significant stenosis. Right MCA M1 segment and right MCA bifurcation are patent without stenosis. Right MCA branches are mildly irregular. Left MCA M1 segment and trifurcation are patent without stenosis. Left MCA branches are mildly irregular. No left MCA branch occlusion identified. Venous sinuses: Early contrast timing, but grossly patent. Anatomic variants: Dominant left vertebral artery. Review of the MIP images confirms the above findings IMPRESSION: 1. Negative for large vessel occlusion. No core infarct detected by CT Perfusion, with trace (3 mL)  abnormal Tmax volume in the inferior left temporal lobe favored to be artifactual. 2. Positive arterial findings include: - intracranial > extracranial atherosclerosis with up to severe Left PCA stenosis in the P3 segment. - suspected bilateral cervical ICA Fibromuscular Dysplasia (FMD). - Aortic Atherosclerosis (ICD10-I70.0). 3. A 3.7 cm soft tissue mass at the right thoracic inlet mass is favored to be a large exophytic thyroid nodule. No malignant features by CT, but this meets size criteria for follow-up Thyroid Ultrasound (ref: J Am Coll Radiol. 2015 Feb;12(2): 143-50). Preliminary vascular results were discussed by telephone with Dr. Lorrin Goodell on 03/01/2020 at 1730 hours. Electronically Signed   By: Genevie Ann M.D.   On: 03/01/2020 17:50   CT HEAD CODE STROKE WO CONTRAST  Result Date: 03/01/2020 CLINICAL DATA:  Code stroke. 79 year old female with right side deficits. Recent fall. EXAM: CT HEAD WITHOUT CONTRAST TECHNIQUE: Contiguous axial images were obtained from the base of the skull through the vertex without intravenous contrast. COMPARISON:  None. FINDINGS: Brain: No ventriculomegaly. No midline shift, mass effect, or evidence of intracranial mass lesion. No acute intracranial hemorrhage identified. Fairly extensive bilateral cerebral white matter and deep gray matter nuclei hypodensity in keeping with chronic small vessel ischemic disease. No superimposed changes of acute cortically based infarct identified. No cortical encephalomalacia identified. Vascular: Calcified atherosclerosis at the skull base. No suspicious intracranial vascular hyperdensity. Skull: No acute osseous abnormality identified. Sinuses/Orbits: Visualized paranasal sinuses and mastoids are clear. Other: No acute orbit or scalp soft tissue finding. ASPECTS [facility] Stroke Program Early CT Score) Total score (0-10 with 10 being normal): 10 IMPRESSION: 1. Evidence of advanced small vessel disease. No acute cortically based infarct or  acute intracranial hemorrhage identified. 2. ASPECTS 10. 3. These results were communicated to D. Khaliqdina at 5:17 pmon 03/01/2020 by text page via the [facility] messaging system. Electronically Signed   By: Genevie Ann M.D.   On: 03/01/2020 17:18   CT ANGIO HEAD CODE STROKE  Result Date: 03/01/2020 CLINICAL DATA:  79 year old code stroke presentation, right side visual deficit. Reportedly no weakness or aphasia at this time. EXAM: CT ANGIOGRAPHY HEAD AND NECK CT PERFUSION BRAIN TECHNIQUE: Multidetector CT imaging of the head and neck was performed using the standard protocol during bolus administration of intravenous contrast. Multiplanar CT image reconstructions and MIPs were obtained to evaluate the vascular anatomy. Carotid stenosis measurements (when applicable) are obtained utilizing NASCET criteria, using the distal internal  carotid diameter as the denominator. Multiphase CT imaging of the brain was performed following IV bolus contrast injection. Subsequent parametric perfusion maps were calculated using RAPID software. CONTRAST:  180mL OMNIPAQUE IOHEXOL 350 MG/ML SOLN COMPARISON:  Plain head CT 1708 hours today. FINDINGS: CT Brain Perfusion Findings: ASPECTS: 10 CBF (<30%) Volume: NonemL Perfusion (Tmax>6.0s) volume: 33mL, inferior left temporal lobe favored to be artifact. Mismatch Volume: 21mL, favored to be artifact. Infarction Location:No core infarct detected CTA NECK Skeleton: Absent mandible dentition. Degenerative changes in the cervical and upper thoracic spine, including degenerative appearing spondylolisthesis at C7-T1 and T1-T2. No acute osseous abnormality identified. Upper chest: Negative. Other neck: At the right thoracic inlet there is an oval heterogeneously hypodense soft tissue mass measuring up to 3.7 cm long axis (series 8, image 134 and series 6, image 139) inseparable from the right tracheoesophageal groove and also the lower pole of the right thyroid where there is evidence of a "claw  sign" (series 6, image 136). Burtis Junes this is an exophytic thyroid nodule. No superimposed generalized thoracic inlet lymphadenopathy. Other neck soft tissue contours are within normal limits. Aortic arch: 3 vessel arch configuration. Mild to moderate soft and calcified arch atherosclerosis. Right carotid system: No brachiocephalic or right CCA origin stenosis despite mild plaque. Mild plaque also at the right bifurcation. No cervical right ICA stenosis. Tortuous vessel distal to the bulb, and mildly beaded appearance of the distal cervical ICA (series 11, image 18). Left carotid system: Mild left CCA plaque without stenosis. Mild to moderate soft plaque at the left ICA origin with less than 50 % stenosis with respect to the distal vessel (series 10, image 131). Tortuous vessel with a mildly kinked appearance at the distal bulb. Mildly beaded appearance of the left ICA distal to the bulb also similar to the right side. Vertebral arteries: Mild plaque in the proximal right subclavian artery without stenosis. Normal right vertebral artery origin. Non dominant right vertebral artery is patent to the skull base without significant plaque or stenosis. Soft and calcified plaque in the proximal left subclavian artery without stenosis. Normal left vertebral artery origin. Dominant left vertebral with a tortuous V1 segment. Patent left vertebral to the skull base without significant plaque or stenosis. CTA HEAD Posterior circulation: The non dominant right vertebral is diminutive beyond the patent right PICA origin. Normal left PICA origin. There is bilateral distal vertebral plaque but no significant stenosis. Tortuous vertebrobasilar junction is patent without stenosis. Patent, tortuous basilar artery. Patent SCA and PCA origins. Posterior communicating arteries are diminutive or absent. Mild bilateral P1 segment irregularity. Mild to moderate right P2 but severe left P3 segment stenosis (series 11, image 27). Although there  is no discrete PCA branch occlusion. The distal PCA branches appear fairly symmetric, irregular. Anterior circulation: Both ICA siphons are patent. Mild to moderate bilateral siphon calcified plaque, greater on the left with no hemodynamically significant stenosis. Both ophthalmic artery origins are patent and appear normal. Patent carotid termini. Normal MCA and ACA origins. Anterior communicating artery is normal. Both PCAs are mildly tortuous and irregular. But patent without significant stenosis. Right MCA M1 segment and right MCA bifurcation are patent without stenosis. Right MCA branches are mildly irregular. Left MCA M1 segment and trifurcation are patent without stenosis. Left MCA branches are mildly irregular. No left MCA branch occlusion identified. Venous sinuses: Early contrast timing, but grossly patent. Anatomic variants: Dominant left vertebral artery. Review of the MIP images confirms the above findings IMPRESSION: 1. Negative for large vessel occlusion.  No core infarct detected by CT Perfusion, with trace (3 mL) abnormal Tmax volume in the inferior left temporal lobe favored to be artifactual. 2. Positive arterial findings include: - intracranial > extracranial atherosclerosis with up to severe Left PCA stenosis in the P3 segment. - suspected bilateral cervical ICA Fibromuscular Dysplasia (FMD). - Aortic Atherosclerosis (ICD10-I70.0). 3. A 3.7 cm soft tissue mass at the right thoracic inlet mass is favored to be a large exophytic thyroid nodule. No malignant features by CT, but this meets size criteria for follow-up Thyroid Ultrasound (ref: J Am Coll Radiol. 2015 Feb;12(2): 143-50). Preliminary vascular results were discussed by telephone with Dr. Lorrin Goodell on 03/01/2020 at 1730 hours. Electronically Signed   By: Genevie Ann M.D.   On: 03/01/2020 17:50   CT ANGIO NECK CODE STROKE  Result Date: 03/01/2020 CLINICAL DATA:  79 year old code stroke presentation, right side visual deficit. Reportedly no  weakness or aphasia at this time. EXAM: CT ANGIOGRAPHY HEAD AND NECK CT PERFUSION BRAIN TECHNIQUE: Multidetector CT imaging of the head and neck was performed using the standard protocol during bolus administration of intravenous contrast. Multiplanar CT image reconstructions and MIPs were obtained to evaluate the vascular anatomy. Carotid stenosis measurements (when applicable) are obtained utilizing NASCET criteria, using the distal internal carotid diameter as the denominator. Multiphase CT imaging of the brain was performed following IV bolus contrast injection. Subsequent parametric perfusion maps were calculated using RAPID software. CONTRAST:  169mL OMNIPAQUE IOHEXOL 350 MG/ML SOLN COMPARISON:  Plain head CT 1708 hours today. FINDINGS: CT Brain Perfusion Findings: ASPECTS: 10 CBF (<30%) Volume: NonemL Perfusion (Tmax>6.0s) volume: 42mL, inferior left temporal lobe favored to be artifact. Mismatch Volume: 70mL, favored to be artifact. Infarction Location:No core infarct detected CTA NECK Skeleton: Absent mandible dentition. Degenerative changes in the cervical and upper thoracic spine, including degenerative appearing spondylolisthesis at C7-T1 and T1-T2. No acute osseous abnormality identified. Upper chest: Negative. Other neck: At the right thoracic inlet there is an oval heterogeneously hypodense soft tissue mass measuring up to 3.7 cm long axis (series 8, image 134 and series 6, image 139) inseparable from the right tracheoesophageal groove and also the lower pole of the right thyroid where there is evidence of a "claw sign" (series 6, image 136). Burtis Junes this is an exophytic thyroid nodule. No superimposed generalized thoracic inlet lymphadenopathy. Other neck soft tissue contours are within normal limits. Aortic arch: 3 vessel arch configuration. Mild to moderate soft and calcified arch atherosclerosis. Right carotid system: No brachiocephalic or right CCA origin stenosis despite mild plaque. Mild plaque  also at the right bifurcation. No cervical right ICA stenosis. Tortuous vessel distal to the bulb, and mildly beaded appearance of the distal cervical ICA (series 11, image 18). Left carotid system: Mild left CCA plaque without stenosis. Mild to moderate soft plaque at the left ICA origin with less than 50 % stenosis with respect to the distal vessel (series 10, image 131). Tortuous vessel with a mildly kinked appearance at the distal bulb. Mildly beaded appearance of the left ICA distal to the bulb also similar to the right side. Vertebral arteries: Mild plaque in the proximal right subclavian artery without stenosis. Normal right vertebral artery origin. Non dominant right vertebral artery is patent to the skull base without significant plaque or stenosis. Soft and calcified plaque in the proximal left subclavian artery without stenosis. Normal left vertebral artery origin. Dominant left vertebral with a tortuous V1 segment. Patent left vertebral to the skull base without significant plaque  or stenosis. CTA HEAD Posterior circulation: The non dominant right vertebral is diminutive beyond the patent right PICA origin. Normal left PICA origin. There is bilateral distal vertebral plaque but no significant stenosis. Tortuous vertebrobasilar junction is patent without stenosis. Patent, tortuous basilar artery. Patent SCA and PCA origins. Posterior communicating arteries are diminutive or absent. Mild bilateral P1 segment irregularity. Mild to moderate right P2 but severe left P3 segment stenosis (series 11, image 27). Although there is no discrete PCA branch occlusion. The distal PCA branches appear fairly symmetric, irregular. Anterior circulation: Both ICA siphons are patent. Mild to moderate bilateral siphon calcified plaque, greater on the left with no hemodynamically significant stenosis. Both ophthalmic artery origins are patent and appear normal. Patent carotid termini. Normal MCA and ACA origins. Anterior  communicating artery is normal. Both PCAs are mildly tortuous and irregular. But patent without significant stenosis. Right MCA M1 segment and right MCA bifurcation are patent without stenosis. Right MCA branches are mildly irregular. Left MCA M1 segment and trifurcation are patent without stenosis. Left MCA branches are mildly irregular. No left MCA branch occlusion identified. Venous sinuses: Early contrast timing, but grossly patent. Anatomic variants: Dominant left vertebral artery. Review of the MIP images confirms the above findings IMPRESSION: 1. Negative for large vessel occlusion. No core infarct detected by CT Perfusion, with trace (3 mL) abnormal Tmax volume in the inferior left temporal lobe favored to be artifactual. 2. Positive arterial findings include: - intracranial > extracranial atherosclerosis with up to severe Left PCA stenosis in the P3 segment. - suspected bilateral cervical ICA Fibromuscular Dysplasia (FMD). - Aortic Atherosclerosis (ICD10-I70.0). 3. A 3.7 cm soft tissue mass at the right thoracic inlet mass is favored to be a large exophytic thyroid nodule. No malignant features by CT, but this meets size criteria for follow-up Thyroid Ultrasound (ref: J Am Coll Radiol. 2015 Feb;12(2): 143-50). Preliminary vascular results were discussed by telephone with Dr. Lorrin Goodell on 03/01/2020 at 1730 hours. Electronically Signed   By: Genevie Ann M.D.   On: 03/01/2020 17:50    EKG: Independently reviewed.  Sinus rhythm  Assessment/Plan Active Problems:   Syncope and collapse  Slurred speech, ?TIA Orthostatic hypotension Syncope and collapse - Patient has a history of orthostatic hypotension which has been worked up by her cardiologist - Present following and more severe episode of syncope and collapse after which she was noticed to have eye twitching, slurred speech, and some possible weakness; but was returned to baseline in ED - Code stroke canceled possibility of TIA (given cerebral  atherosclerosis) versus syncopal event due to orthostasis - Denies preceding symptoms however does have significant dementia - Neurology consulted in ED appreciate recommendations - We will avoid duplicate work-up given recent studies in April and May - We will recheck orthostatics may need continued reduction in antihypertensives - Monitor on telemetry overnight  Dementia - Continue home memantine, donepezil - Continue BuSpar, duloxetine - Patient's daughter notes that she is trying to get up at times in the ED room; she states that she is her mother may require medications to help if she becomes agitated - Delirium precautions  Hypertension - Holding home medication for now as she may need a reduced dose given her persistent orthostatic hypotension potential leading to above syncopal event  3.7 cm thyroid mass on CT -We will need follow-up ultrasound  DVT prophylaxis: Lovenox  Code Status:   DNR  Family Communication:  Discussed with daughter at bedside   Disposition Plan:   Patient is  from:  Home  Anticipated DC to:  Home  Anticipated DC date:  10/12    Consults called:  Neurology in ED  Admission status:  Observation, telemetry   Severity of Illness: The appropriate patient status for this patient is OBSERVATION. Observation status is judged to be reasonable and necessary in order to provide the required intensity of service to ensure the patient's safety. The patient's presenting symptoms, physical exam findings, and initial radiographic and laboratory data in the context of their medical condition is felt to place them at decreased risk for further clinical deterioration. Furthermore, it is anticipated that the patient will be medically stable for discharge from the hospital within 2 midnights of admission. The following factors support the patient status of observation.   " The patient's presenting symptoms include syncope. " The physical exam findings include  confusion. " The initial radiographic and laboratory data are significant for cerebral atherosclerosis.  Marcelyn Bruins MD [facility]  How to contact the [facility] Attending or Consulting provider [place] or covering provider during after hours [place], for this patient?   1. Check the care team in [facility] and look for a) attending/consulting [facility] provider listed and b) the [facility] team listed 2. Log into [email or web address] and use [facility]'s universal password to access. If you do not have the password, please contact the hospital operator. 3. Locate the [facility] provider you are looking for under [facility] and page to a number that you can be directly reached. 4. If you still have difficulty reaching the provider, please page the [facility] (Director on Call) for the Hospitalists listed on amion for assistance.  03/01/2020, 7:20 PM

## 2020-03-01 NOTE — ED Triage Notes (Signed)
Pt bib [facility]. Unwitnessed fall. Unknown LOC. Pt found by husband who called EMS. EMS reports new altered mental status and right sided weakness in EMS truck. Code stroke called 1622.

## 2020-03-01 NOTE — Consult Note (Addendum)
NEUROLOGY CONSULTATION NOTE   Date of service: March 01, 2020 Patient Name: Marisa Hall MRN:  [id number] DOB:  1940/09/12 Reason for consult: "Stroke code"  History of Present Illness  Marisa Hall is a 79 y.o. female with PMH significant for Hyperlipemia, Hypertension, and Orthostatic hypotension who presents with fall, then AMS and noted by EMS to have some R sided weakness which resolved but noted to have some R vision deficit on exam.  Patient has advanced dementia and daughter is her POA. Daughter reports that patient has had a few episodes of loss of consciousness over the last fwe months. Most recently had one yesterday and today had a fall and was confused afterwards. Per daughter the fall today happened close to 1430 on 03/01/20. She left the room to grab a book and that is when they heard a noise and found her on the floor. She hit the R side of her head. She is not on any blood thinners.  On exam, concern for R hemivisual field defect. On further questioning, patient has had cataract surgeries on both eyes in the last 2 months. She seems to be trying to guess the number of fingers that are put up on the R side. She has no difficulty with couting fingers on the left.  Workup with CTH, CTA and CTP was negative for a large territory infarct, no LVO, no mismath. MRI Brain with DWI imaging was negative for stroke.  NIHSS: 3 for vision deficit, unable to state month and age. MRS: 1 TPA: not given 2/2 negative MRI Brain Thrombectomy: Not a candidate, no LVO.   ROS   Constitutional Denies weight loss, fever and chills.  HEENT Denies changes in vision and hearing.  Respiratory Denies SOB and cough.  CV Denies palpitations and CP  GI Denies abdominal pain, nausea, vomiting and diarrhea.  GU Denies dysuria and urinary frequency.  MSK Denies myalgia and joint pain.  Skin Denies rash and pruritus.  Neurological Denies headache and syncope.  Psychiatric Denies recent changes  in mood. Denies anxiety and depression.    Past History   Past Medical History:  Diagnosis Date  . Hyperlipemia   . Hypertension   . Orthostatic hypotension    Past Surgical History:  Procedure Laterality Date  . APPENDECTOMY    . CHOLECYSTECTOMY    . JOINT REPLACEMENT  2010  . LEG AMPUTATION    . TONSILLECTOMY    . WISDOM TOOTH EXTRACTION     Family History  Problem Relation Age of Onset  . Hypertension Father   . Dementia Father   . Colon cancer Sister    Social History   Socioeconomic History  . Marital status: Married    Spouse name: Not on file  . Number of children: Not on file  . Years of education: Not on file  . Highest education level: Not on file  Occupational History  . Not on file  Tobacco Use  . Smoking status: Never Smoker  . Smokeless tobacco: Never Used  Substance and Sexual Activity  . Alcohol use: Not Currently  . Drug use: Not on file  . Sexual activity: Not on file  Other Topics Concern  . Not on file  Social History Narrative  . Not on file   Social Determinants of Health   Financial Resource Strain:   . Difficulty of Paying Living Expenses: Not on file  Food Insecurity:   . Worried About Charity fundraiser in the Last Year: Not  on file  . Ran Out of Food in the Last Year: Not on file  Transportation Needs:   . Lack of Transportation (Medical): Not on file  . Lack of Transportation (Non-Medical): Not on file  Physical Activity:   . Days of Exercise per Week: Not on file  . Minutes of Exercise per Session: Not on file  Stress:   . Feeling of Stress : Not on file  Social Connections:   . Frequency of Communication with Friends and Family: Not on file  . Frequency of Social Gatherings with Friends and Family: Not on file  . Attends Religious Services: Not on file  . Active Member of Clubs or Organizations: Not on file  . Attends Archivist Meetings: Not on file  . Marital Status: Not on file   Allergies  Allergen  Reactions  . Effexor Xr [Venlafaxine]     Medications  (Not in a hospital admission)    Vitals   Vitals:   03/01/20 1703 03/01/20 1710 03/01/20 1726 03/01/20 1730  BP: 111/86 135/68  (!) 151/79  Pulse: 74   74  Resp:    18  Temp:   98.5 F (36.9 C)   TempSrc:   Oral   SpO2: 95%   98%  Weight: 74.2 kg     Height: 5\' 4"  (1.626 m)        Body mass index is 28.08 kg/m.  Physical Exam   General: Laying comfortably in bed; in no acute distress. HENT: Normal oropharynx and mucosa. Normal external appearance of ears and nose.  Neck: Supple, no pain or tenderness  CV: No JVD. No peripheral edema.  Pulmonary: Symmetric Chest rise. Normal respiratory effort.  Abdomen: Soft to touch, non-tender.  Ext: No cyanosis, edema, or deformity  Skin: No rash. Normal palpation of skin.   Musculoskeletal: Normal digits and nails by inspection. No clubbing.   Neurologic Examination  Mental status/Cognition: Alert, oriented to self, place, month and year, good attention.  Speech/language: Fluent, comprehension intact, object naming intact, repetition intact.  Cranial nerves:   CN II Pupils equal and reactive to light, Right VF deficits    CN III,IV,VI EOM intact, no gaze preference or deviation, no nystagmus    CN V normal sensation in V1, V2, and V3 segments bilaterally    CN VII no asymmetry, no nasolabial fold flattening    CN VIII normal hearing to speech   CN IX & X normal palatal elevation, no uvular deviation   CN XI 5/5 head turn and 5/5 shoulder shrug bilaterally   CN XII midline tongue protrusion   Motor:  Muscle bulk: normal, tone normal, pronator drift none tremor none Mvmt Root Nerve  Muscle Right Left Comments  SA C5/6 Ax Deltoid 5 5   EF C5/6 Mc Biceps 5 5   EE C6/7/8 Rad Triceps 5 5   WF C6/7 Med FCR 5 5   WE C7/8 PIN ECU 5 5   F Ab C8/T1 U ADM/FDI 5 5   HF L1/2/3 Fem Illopsoas 5 5   KE L2/3/4 Fem Quad 5 5   DF L4/5 D Peron Tib Ant 5 5   PF S1/2 Tibial Grc/Sol 5 5     Reflexes:  Right Left Comments  Pectoralis      Biceps (C5/6) 1 1   Brachioradialis (C5/6) 1 1    Triceps (C6/7) 1 1    Patellar (L3/4) 1 1    Achilles (S1) 1 1  Hoffman      Plantar     Jaw jerk    Sensation:  Light touch Intact throughout   Pin prick    Temperature    Vibration   Proprioception    Coordination/Complex Motor:  - Finger to Nose intact BL - Heel to shin intact BL  Labs   CBC:  Recent Labs  Lab 03/01/20 1654  WBC 5.9  NEUTROABS 4.3  HGB 13.6  HCT 43.1  MCV 98.9  PLT 196    Basic Metabolic Panel:  Lab Results  Component Value Date   NA 138 03/01/2020   K 4.2 03/01/2020   CO2 25 03/01/2020   GLUCOSE 120 (H) 03/01/2020   BUN 21 03/01/2020   CREATININE 1.33 (H) 03/01/2020   CALCIUM 9.5 03/01/2020   GFRNONAA 38 (L) 03/01/2020   GFRAA 47 (L) 09/08/2019   Lipid Panel: No results found for: LDLCALC HgbA1c: No results found for: HGBA1C Urine Drug Screen: No results found for: LABOPIA, COCAINSCRNUR, LABBENZ, AMPHETMU, THCU, LABBARB  Alcohol Level     Component Value Date/Time   ETH <10 03/01/2020 1654     Imaging and Diagnostic studies  Results for orders placed during the hospital encounter of 03/01/20  MR BRAIN WO CONTRAST  Narrative CLINICAL DATA:  79 year old female with dementia. Right side visual defect. Code stroke presentation. Intracranial atherosclerosis on CTA.  EXAM: MRI HEAD WITHOUT CONTRAST LIMITED  TECHNIQUE: Multiplanar, multiecho pulse sequences of the brain and surrounding structures were obtained without intravenous contrast.  COMPARISON:  CT head, CTA and CTP the same day.  FINDINGS: Study was limited by neurology request to DWI and SWI.  No restricted diffusion or evidence of acute infarction. No midline shift, mass effect, or evidence of intracranial mass lesion. No ventriculomegaly.  On SWI no evidence of acute or chronic cerebral hemorrhage is identified.  There is evidence of a right  cerebellar developmental venous anomaly (normal variant - series 4, image 22).  IMPRESSION: No evidence of acute infarct.  Study discussed by telephone with Dr. Donnetta Simpers on 03/01/2020 at 1750 hours.   Electronically Signed By: Genevie Ann M.D. On: 03/01/2020 18:03   Impression   Marisa Hall is a 79 y.o. female with PMH significant for Hyperlipemia, Hypertension, and Orthostatic hypotension who presents with fall, then AMS and noted by EMS to have some R sided weakness which resolved but noted to have some R vision deficit on exam. She has some underlying cognitive deficit but in addition to that, she has some concern for R sided vision deficit. It is very difficult to assess on exam if this is a hemivisual field loss vs a R eye monooccular vision loss. MRI Brain is negative for any acute abnormality, no stroke. I suspect that this may be just from her eye.  Recommendations  - Recommend having optho to weigh in on her R vision loss. - Recommend orthostatic vitals. ______________________________________________________________________  This patient is critically ill and at significant risk of neurological worsening, death and care requires constant monitoring of vital signs, hemodynamics,respiratory and cardiac monitoring, neurological assessment, discussion with family, other specialists and medical decision making of high complexity. I spent 75 minutes of neurocritical care time  in the care of  this patient. This was time spent independent of any time provided by nurse practitioner or PA.  Donnetta Simpers [facility] Pager Number [phone] 03/01/2020  6:25 PM   Thank you for the opportunity to take part in the care of this patient. If  you have any further questions, please contact the neurology consultation attending.  Signed,  [place] Pager Number [phone]

## 2020-03-01 NOTE — ED Provider Notes (Signed)
[facility] EMERGENCY DEPARTMENT Provider Note   CSN: [id number] Arrival date & time: 03/01/20  1651  An emergency department physician performed an initial assessment on this suspected stroke patient at 1652.  History Chief Complaint  Patient presents with  . Code Stroke    Marisa Hall is a 79 y.o. female.  HPI   Patient presented to the ED initially as an acute code stroke.  Patient initially had a fall at home.  This was witnessed by family and was approximately 60 minutes prior to arrival in the ED.  The reports were the patient had some eye fluttering but no definite seizure activity.  EMS was called and they activated a code stroke.  During transport they noted significant change in her mental status with confusion.  Patient was unable to speak clearly and had very poor memory.  They also noted some questionable weakness on the right side of her arm and leg.  Patient's symptoms have improved as she has been in the ED.  Family members are currently at the bedside.  She currently seems to be back to her baseline.  Past Medical History:  Diagnosis Date  . Hyperlipemia   . Hypertension   . Orthostatic hypotension     Patient Active Problem List   Diagnosis Date Noted  . Orthostatic hypotension 09/08/2019  . Hyperlipemia 09/08/2019  . Hypertension 09/08/2019  . Chest discomfort 03/28/2018    Past Surgical History:  Procedure Laterality Date  . APPENDECTOMY    . CHOLECYSTECTOMY    . JOINT REPLACEMENT  2010  . LEG AMPUTATION    . TONSILLECTOMY    . WISDOM TOOTH EXTRACTION       OB History   No obstetric history on file.     Family History  Problem Relation Age of Onset  . Hypertension Father   . Dementia Father   . Colon cancer Sister     Social History   Tobacco Use  . Smoking status: Never Smoker  . Smokeless tobacco: Never Used  Substance Use Topics  . Alcohol use: Not Currently  . Drug use: Not on file    Home  Medications Prior to Admission medications   Medication Sig Start Date End Date Taking? Authorizing Provider  benazepril (LOTENSIN) 5 MG tablet Take 1 tablet (5 mg total) by mouth daily. 09/08/19   Revankar, Reita Cliche, MD  busPIRone (BUSPAR) 7.5 MG tablet Take 1 tablet by mouth daily. 10/12/19   [provider]  dicyclomine (BENTYL) 10 MG capsule Take 10 mg by mouth 4 (four) times daily as needed. Stomach pain    [provider]  donepezil (ARICEPT) 10 MG tablet Take 1 tablet by mouth daily. 08/17/19   [provider]  meclizine (ANTIVERT) 25 MG tablet Take 1 tablet by mouth as needed. 03/22/18   [provider]  meloxicam (MOBIC) 7.5 MG tablet Take 1 tablet by mouth daily. 01/20/18   [provider]  memantine (NAMENDA) 10 MG tablet Take 1 tablet by mouth daily. 09/25/19   [provider]  omeprazole (PRILOSEC) 20 MG capsule Take 20 mg by mouth daily.    [provider]  pravastatin (PRAVACHOL) 40 MG tablet Take 1 tablet by mouth daily. 01/17/18   [provider]    Allergies    Effexor xr [venlafaxine]  Review of Systems   Review of Systems  All other systems reviewed and are negative.   Physical Exam Updated Vital Signs BP (!) 139/97  Pulse 62   Temp 98.5 F (36.9 C) (Oral)   Resp 14   Ht 1.626 m (5\' 4" )   Wt 74.2 kg   SpO2 100%   BMI 28.08 kg/m   Physical Exam Vitals and nursing note reviewed.  Constitutional:      General: She is not in acute distress.    Appearance: She is well-developed.  HENT:     Head: Normocephalic and atraumatic.     Right Ear: External ear normal.     Left Ear: External ear normal.  Eyes:     General: No scleral icterus.       Right eye: No discharge.        Left eye: No discharge.     Conjunctiva/sclera: Conjunctivae normal.  Neck:     Trachea: No tracheal deviation.  Cardiovascular:     Rate and Rhythm: Normal rate and regular rhythm.  Pulmonary:     Effort: Pulmonary  effort is normal. No respiratory distress.     Breath sounds: Normal breath sounds. No stridor. No wheezing or rales.  Abdominal:     General: Bowel sounds are normal. There is no distension.     Palpations: Abdomen is soft.     Tenderness: There is no abdominal tenderness. There is no guarding or rebound.  Musculoskeletal:        General: No tenderness.     Cervical back: Neck supple.  Skin:    General: Skin is warm and dry.     Findings: No rash.  Neurological:     Mental Status: She is alert.     Cranial Nerves: No cranial nerve deficit (no facial droop, extraocular movements intact, no slurred speech).     Sensory: No sensory deficit.     Motor: No abnormal muscle tone or seizure activity.     Coordination: Coordination normal.     ED Results / Procedures / Treatments   Labs (all labs ordered are listed, but only abnormal results are displayed) Labs Reviewed  COMPREHENSIVE METABOLIC PANEL - Abnormal; Notable for the following components:      Result Value   Glucose, Bld 120 (*)    Creatinine, Ser 1.33 (*)    Total Protein 5.8 (*)    GFR, Estimated 38 (*)    All other components within normal limits  CBG MONITORING, ED - Abnormal; Notable for the following components:   Glucose-Capillary 114 (*)    All other components within normal limits  ETHANOL  PROTIME-INR  APTT  CBC  DIFFERENTIAL  RAPID URINE DRUG SCREEN, HOSP PERFORMED  URINALYSIS, ROUTINE W REFLEX MICROSCOPIC  I-STAT CHEM 8, ED    EKG EKG Interpretation  Date/Time:  Monday March 01 2020 17:27:46 EDT Ventricular Rate:  80 PR Interval:    QRS Duration: 90 QT Interval:  376 QTC Calculation: 434 R Axis:     Text Interpretation: Sinus rhythm No old tracing to compare Confirmed by Dorie Rank [phone]) on 03/01/2020 5:31:14 PM   Radiology MR BRAIN WO CONTRAST  Result Date: 03/01/2020 CLINICAL DATA:  79 year old female with dementia. Right side visual defect. Code stroke presentation. Intracranial  atherosclerosis on CTA. EXAM: MRI HEAD WITHOUT CONTRAST LIMITED TECHNIQUE: Multiplanar, multiecho pulse sequences of the brain and surrounding structures were obtained without intravenous contrast. COMPARISON:  CT head, CTA and CTP the same day. FINDINGS: Study was limited by neurology request to DWI and SWI. No restricted diffusion or evidence of acute infarction. No midline shift, mass effect, or evidence of intracranial  mass lesion. No ventriculomegaly. On SWI no evidence of acute or chronic cerebral hemorrhage is identified. There is evidence of a right cerebellar developmental venous anomaly (normal variant - series 4, image 22). IMPRESSION: No evidence of acute infarct. Study discussed by telephone with Dr. Donnetta Simpers on 03/01/2020 at 1750 hours. Electronically Signed   By: Genevie Ann M.D.   On: 03/01/2020 18:03   CT CEREBRAL PERFUSION W CONTRAST  Result Date: 03/01/2020 CLINICAL DATA:  79 year old code stroke presentation, right side visual deficit. Reportedly no weakness or aphasia at this time. EXAM: CT ANGIOGRAPHY HEAD AND NECK CT PERFUSION BRAIN TECHNIQUE: Multidetector CT imaging of the head and neck was performed using the standard protocol during bolus administration of intravenous contrast. Multiplanar CT image reconstructions and MIPs were obtained to evaluate the vascular anatomy. Carotid stenosis measurements (when applicable) are obtained utilizing NASCET criteria, using the distal internal carotid diameter as the denominator. Multiphase CT imaging of the brain was performed following IV bolus contrast injection. Subsequent parametric perfusion maps were calculated using RAPID software. CONTRAST:  133mL OMNIPAQUE IOHEXOL 350 MG/ML SOLN COMPARISON:  Plain head CT 1708 hours today. FINDINGS: CT Brain Perfusion Findings: ASPECTS: 10 CBF (<30%) Volume: NonemL Perfusion (Tmax>6.0s) volume: 50mL, inferior left temporal lobe favored to be artifact. Mismatch Volume: 62mL, favored to be artifact.  Infarction Location:No core infarct detected CTA NECK Skeleton: Absent mandible dentition. Degenerative changes in the cervical and upper thoracic spine, including degenerative appearing spondylolisthesis at C7-T1 and T1-T2. No acute osseous abnormality identified. Upper chest: Negative. Other neck: At the right thoracic inlet there is an oval heterogeneously hypodense soft tissue mass measuring up to 3.7 cm long axis (series 8, image 134 and series 6, image 139) inseparable from the right tracheoesophageal groove and also the lower pole of the right thyroid where there is evidence of a "claw sign" (series 6, image 136). Burtis Junes this is an exophytic thyroid nodule. No superimposed generalized thoracic inlet lymphadenopathy. Other neck soft tissue contours are within normal limits. Aortic arch: 3 vessel arch configuration. Mild to moderate soft and calcified arch atherosclerosis. Right carotid system: No brachiocephalic or right CCA origin stenosis despite mild plaque. Mild plaque also at the right bifurcation. No cervical right ICA stenosis. Tortuous vessel distal to the bulb, and mildly beaded appearance of the distal cervical ICA (series 11, image 18). Left carotid system: Mild left CCA plaque without stenosis. Mild to moderate soft plaque at the left ICA origin with less than 50 % stenosis with respect to the distal vessel (series 10, image 131). Tortuous vessel with a mildly kinked appearance at the distal bulb. Mildly beaded appearance of the left ICA distal to the bulb also similar to the right side. Vertebral arteries: Mild plaque in the proximal right subclavian artery without stenosis. Normal right vertebral artery origin. Non dominant right vertebral artery is patent to the skull base without significant plaque or stenosis. Soft and calcified plaque in the proximal left subclavian artery without stenosis. Normal left vertebral artery origin. Dominant left vertebral with a tortuous V1 segment. Patent left  vertebral to the skull base without significant plaque or stenosis. CTA HEAD Posterior circulation: The non dominant right vertebral is diminutive beyond the patent right PICA origin. Normal left PICA origin. There is bilateral distal vertebral plaque but no significant stenosis. Tortuous vertebrobasilar junction is patent without stenosis. Patent, tortuous basilar artery. Patent SCA and PCA origins. Posterior communicating arteries are diminutive or absent. Mild bilateral P1 segment irregularity. Mild to moderate  right P2 but severe left P3 segment stenosis (series 11, image 27). Although there is no discrete PCA branch occlusion. The distal PCA branches appear fairly symmetric, irregular. Anterior circulation: Both ICA siphons are patent. Mild to moderate bilateral siphon calcified plaque, greater on the left with no hemodynamically significant stenosis. Both ophthalmic artery origins are patent and appear normal. Patent carotid termini. Normal MCA and ACA origins. Anterior communicating artery is normal. Both PCAs are mildly tortuous and irregular. But patent without significant stenosis. Right MCA M1 segment and right MCA bifurcation are patent without stenosis. Right MCA branches are mildly irregular. Left MCA M1 segment and trifurcation are patent without stenosis. Left MCA branches are mildly irregular. No left MCA branch occlusion identified. Venous sinuses: Early contrast timing, but grossly patent. Anatomic variants: Dominant left vertebral artery. Review of the MIP images confirms the above findings IMPRESSION: 1. Negative for large vessel occlusion. No core infarct detected by CT Perfusion, with trace (3 mL) abnormal Tmax volume in the inferior left temporal lobe favored to be artifactual. 2. Positive arterial findings include: - intracranial > extracranial atherosclerosis with up to severe Left PCA stenosis in the P3 segment. - suspected bilateral cervical ICA Fibromuscular Dysplasia (FMD). - Aortic  Atherosclerosis (ICD10-I70.0). 3. A 3.7 cm soft tissue mass at the right thoracic inlet mass is favored to be a large exophytic thyroid nodule. No malignant features by CT, but this meets size criteria for follow-up Thyroid Ultrasound (ref: J Am Coll Radiol. 2015 Feb;12(2): 143-50). Preliminary vascular results were discussed by telephone with Dr. Lorrin Goodell on 03/01/2020 at 1730 hours. Electronically Signed   By: Genevie Ann M.D.   On: 03/01/2020 17:50   CT HEAD CODE STROKE WO CONTRAST  Result Date: 03/01/2020 CLINICAL DATA:  Code stroke. 80 year old female with right side deficits. Recent fall. EXAM: CT HEAD WITHOUT CONTRAST TECHNIQUE: Contiguous axial images were obtained from the base of the skull through the vertex without intravenous contrast. COMPARISON:  None. FINDINGS: Brain: No ventriculomegaly. No midline shift, mass effect, or evidence of intracranial mass lesion. No acute intracranial hemorrhage identified. Fairly extensive bilateral cerebral white matter and deep gray matter nuclei hypodensity in keeping with chronic small vessel ischemic disease. No superimposed changes of acute cortically based infarct identified. No cortical encephalomalacia identified. Vascular: Calcified atherosclerosis at the skull base. No suspicious intracranial vascular hyperdensity. Skull: No acute osseous abnormality identified. Sinuses/Orbits: Visualized paranasal sinuses and mastoids are clear. Other: No acute orbit or scalp soft tissue finding. ASPECTS [facility] Stroke Program Early CT Score) Total score (0-10 with 10 being normal): 10 IMPRESSION: 1. Evidence of advanced small vessel disease. No acute cortically based infarct or acute intracranial hemorrhage identified. 2. ASPECTS 10. 3. These results were communicated to D. Khaliqdina at 5:17 pmon 03/01/2020 by text page via the [facility] messaging system. Electronically Signed   By: Genevie Ann M.D.   On: 03/01/2020 17:18   CT ANGIO HEAD CODE STROKE  Result Date:  03/01/2020 CLINICAL DATA:  79 year old code stroke presentation, right side visual deficit. Reportedly no weakness or aphasia at this time. EXAM: CT ANGIOGRAPHY HEAD AND NECK CT PERFUSION BRAIN TECHNIQUE: Multidetector CT imaging of the head and neck was performed using the standard protocol during bolus administration of intravenous contrast. Multiplanar CT image reconstructions and MIPs were obtained to evaluate the vascular anatomy. Carotid stenosis measurements (when applicable) are obtained utilizing NASCET criteria, using the distal internal carotid diameter as the denominator. Multiphase CT imaging of the brain was performed following IV  bolus contrast injection. Subsequent parametric perfusion maps were calculated using RAPID software. CONTRAST:  164mL OMNIPAQUE IOHEXOL 350 MG/ML SOLN COMPARISON:  Plain head CT 1708 hours today. FINDINGS: CT Brain Perfusion Findings: ASPECTS: 10 CBF (<30%) Volume: NonemL Perfusion (Tmax>6.0s) volume: 62mL, inferior left temporal lobe favored to be artifact. Mismatch Volume: 57mL, favored to be artifact. Infarction Location:No core infarct detected CTA NECK Skeleton: Absent mandible dentition. Degenerative changes in the cervical and upper thoracic spine, including degenerative appearing spondylolisthesis at C7-T1 and T1-T2. No acute osseous abnormality identified. Upper chest: Negative. Other neck: At the right thoracic inlet there is an oval heterogeneously hypodense soft tissue mass measuring up to 3.7 cm long axis (series 8, image 134 and series 6, image 139) inseparable from the right tracheoesophageal groove and also the lower pole of the right thyroid where there is evidence of a "claw sign" (series 6, image 136). Burtis Junes this is an exophytic thyroid nodule. No superimposed generalized thoracic inlet lymphadenopathy. Other neck soft tissue contours are within normal limits. Aortic arch: 3 vessel arch configuration. Mild to moderate soft and calcified arch atherosclerosis.  Right carotid system: No brachiocephalic or right CCA origin stenosis despite mild plaque. Mild plaque also at the right bifurcation. No cervical right ICA stenosis. Tortuous vessel distal to the bulb, and mildly beaded appearance of the distal cervical ICA (series 11, image 18). Left carotid system: Mild left CCA plaque without stenosis. Mild to moderate soft plaque at the left ICA origin with less than 50 % stenosis with respect to the distal vessel (series 10, image 131). Tortuous vessel with a mildly kinked appearance at the distal bulb. Mildly beaded appearance of the left ICA distal to the bulb also similar to the right side. Vertebral arteries: Mild plaque in the proximal right subclavian artery without stenosis. Normal right vertebral artery origin. Non dominant right vertebral artery is patent to the skull base without significant plaque or stenosis. Soft and calcified plaque in the proximal left subclavian artery without stenosis. Normal left vertebral artery origin. Dominant left vertebral with a tortuous V1 segment. Patent left vertebral to the skull base without significant plaque or stenosis. CTA HEAD Posterior circulation: The non dominant right vertebral is diminutive beyond the patent right PICA origin. Normal left PICA origin. There is bilateral distal vertebral plaque but no significant stenosis. Tortuous vertebrobasilar junction is patent without stenosis. Patent, tortuous basilar artery. Patent SCA and PCA origins. Posterior communicating arteries are diminutive or absent. Mild bilateral P1 segment irregularity. Mild to moderate right P2 but severe left P3 segment stenosis (series 11, image 27). Although there is no discrete PCA branch occlusion. The distal PCA branches appear fairly symmetric, irregular. Anterior circulation: Both ICA siphons are patent. Mild to moderate bilateral siphon calcified plaque, greater on the left with no hemodynamically significant stenosis. Both ophthalmic artery  origins are patent and appear normal. Patent carotid termini. Normal MCA and ACA origins. Anterior communicating artery is normal. Both PCAs are mildly tortuous and irregular. But patent without significant stenosis. Right MCA M1 segment and right MCA bifurcation are patent without stenosis. Right MCA branches are mildly irregular. Left MCA M1 segment and trifurcation are patent without stenosis. Left MCA branches are mildly irregular. No left MCA branch occlusion identified. Venous sinuses: Early contrast timing, but grossly patent. Anatomic variants: Dominant left vertebral artery. Review of the MIP images confirms the above findings IMPRESSION: 1. Negative for large vessel occlusion. No core infarct detected by CT Perfusion, with trace (3 mL) abnormal Tmax volume in  the inferior left temporal lobe favored to be artifactual. 2. Positive arterial findings include: - intracranial > extracranial atherosclerosis with up to severe Left PCA stenosis in the P3 segment. - suspected bilateral cervical ICA Fibromuscular Dysplasia (FMD). - Aortic Atherosclerosis (ICD10-I70.0). 3. A 3.7 cm soft tissue mass at the right thoracic inlet mass is favored to be a large exophytic thyroid nodule. No malignant features by CT, but this meets size criteria for follow-up Thyroid Ultrasound (ref: J Am Coll Radiol. 2015 Feb;12(2): 143-50). Preliminary vascular results were discussed by telephone with Dr. Lorrin Goodell on 03/01/2020 at 1730 hours. Electronically Signed   By: Genevie Ann M.D.   On: 03/01/2020 17:50   CT ANGIO NECK CODE STROKE  Result Date: 03/01/2020 CLINICAL DATA:  79 year old code stroke presentation, right side visual deficit. Reportedly no weakness or aphasia at this time. EXAM: CT ANGIOGRAPHY HEAD AND NECK CT PERFUSION BRAIN TECHNIQUE: Multidetector CT imaging of the head and neck was performed using the standard protocol during bolus administration of intravenous contrast. Multiplanar CT image reconstructions and MIPs  were obtained to evaluate the vascular anatomy. Carotid stenosis measurements (when applicable) are obtained utilizing NASCET criteria, using the distal internal carotid diameter as the denominator. Multiphase CT imaging of the brain was performed following IV bolus contrast injection. Subsequent parametric perfusion maps were calculated using RAPID software. CONTRAST:  116mL OMNIPAQUE IOHEXOL 350 MG/ML SOLN COMPARISON:  Plain head CT 1708 hours today. FINDINGS: CT Brain Perfusion Findings: ASPECTS: 10 CBF (<30%) Volume: NonemL Perfusion (Tmax>6.0s) volume: 28mL, inferior left temporal lobe favored to be artifact. Mismatch Volume: 18mL, favored to be artifact. Infarction Location:No core infarct detected CTA NECK Skeleton: Absent mandible dentition. Degenerative changes in the cervical and upper thoracic spine, including degenerative appearing spondylolisthesis at C7-T1 and T1-T2. No acute osseous abnormality identified. Upper chest: Negative. Other neck: At the right thoracic inlet there is an oval heterogeneously hypodense soft tissue mass measuring up to 3.7 cm long axis (series 8, image 134 and series 6, image 139) inseparable from the right tracheoesophageal groove and also the lower pole of the right thyroid where there is evidence of a "claw sign" (series 6, image 136). Burtis Junes this is an exophytic thyroid nodule. No superimposed generalized thoracic inlet lymphadenopathy. Other neck soft tissue contours are within normal limits. Aortic arch: 3 vessel arch configuration. Mild to moderate soft and calcified arch atherosclerosis. Right carotid system: No brachiocephalic or right CCA origin stenosis despite mild plaque. Mild plaque also at the right bifurcation. No cervical right ICA stenosis. Tortuous vessel distal to the bulb, and mildly beaded appearance of the distal cervical ICA (series 11, image 18). Left carotid system: Mild left CCA plaque without stenosis. Mild to moderate soft plaque at the left ICA origin  with less than 50 % stenosis with respect to the distal vessel (series 10, image 131). Tortuous vessel with a mildly kinked appearance at the distal bulb. Mildly beaded appearance of the left ICA distal to the bulb also similar to the right side. Vertebral arteries: Mild plaque in the proximal right subclavian artery without stenosis. Normal right vertebral artery origin. Non dominant right vertebral artery is patent to the skull base without significant plaque or stenosis. Soft and calcified plaque in the proximal left subclavian artery without stenosis. Normal left vertebral artery origin. Dominant left vertebral with a tortuous V1 segment. Patent left vertebral to the skull base without significant plaque or stenosis. CTA HEAD Posterior circulation: The non dominant right vertebral is diminutive beyond the  patent right PICA origin. Normal left PICA origin. There is bilateral distal vertebral plaque but no significant stenosis. Tortuous vertebrobasilar junction is patent without stenosis. Patent, tortuous basilar artery. Patent SCA and PCA origins. Posterior communicating arteries are diminutive or absent. Mild bilateral P1 segment irregularity. Mild to moderate right P2 but severe left P3 segment stenosis (series 11, image 27). Although there is no discrete PCA branch occlusion. The distal PCA branches appear fairly symmetric, irregular. Anterior circulation: Both ICA siphons are patent. Mild to moderate bilateral siphon calcified plaque, greater on the left with no hemodynamically significant stenosis. Both ophthalmic artery origins are patent and appear normal. Patent carotid termini. Normal MCA and ACA origins. Anterior communicating artery is normal. Both PCAs are mildly tortuous and irregular. But patent without significant stenosis. Right MCA M1 segment and right MCA bifurcation are patent without stenosis. Right MCA branches are mildly irregular. Left MCA M1 segment and trifurcation are patent without  stenosis. Left MCA branches are mildly irregular. No left MCA branch occlusion identified. Venous sinuses: Early contrast timing, but grossly patent. Anatomic variants: Dominant left vertebral artery. Review of the MIP images confirms the above findings IMPRESSION: 1. Negative for large vessel occlusion. No core infarct detected by CT Perfusion, with trace (3 mL) abnormal Tmax volume in the inferior left temporal lobe favored to be artifactual. 2. Positive arterial findings include: - intracranial > extracranial atherosclerosis with up to severe Left PCA stenosis in the P3 segment. - suspected bilateral cervical ICA Fibromuscular Dysplasia (FMD). - Aortic Atherosclerosis (ICD10-I70.0). 3. A 3.7 cm soft tissue mass at the right thoracic inlet mass is favored to be a large exophytic thyroid nodule. No malignant features by CT, but this meets size criteria for follow-up Thyroid Ultrasound (ref: J Am Coll Radiol. 2015 Feb;12(2): 143-50). Preliminary vascular results were discussed by telephone with Dr. Lorrin Goodell on 03/01/2020 at 1730 hours. Electronically Signed   By: Genevie Ann M.D.   On: 03/01/2020 17:50    Procedures Procedures (including critical care time)  Medications Ordered in ED Medications  iohexol (OMNIPAQUE) 350 MG/ML injection 100 mL (100 mLs Intravenous Contrast Given 03/01/20 1731)    ED Course  I have reviewed the triage vital signs and the nursing notes.  Pertinent labs & imaging results that were available during my care of the patient were reviewed by me and considered in my medical decision making (see chart for details).    MDM Rules/Calculators/A&P                          Patient presented to the ED after a syncopal episode and possible code stroke.  Stroke was activated out in the field.  Patient was immediately evaluated by the neurology team.  Patient underwent CT imaging as well as MRI brain imaging.  Patient does not have any evidence of stroke.  Not felt to be a candidate  at this time.  Patient symptoms are rapidly improving.  Laboratory tests are otherwise reassuring.  Her vitals are stable.  No cardiac dysrhythmia noted.  I will consult the medical service for admission and further treatment. Final Clinical Impression(s) / ED Diagnoses Final diagnoses:  TIA (transient ischemic attack)  Syncope, unspecified syncope type      Dorie Rank, MD 03/01/20 [phone]

## 2020-03-01 NOTE — ED Notes (Signed)
Pt transported to MRI with this RN. 

## 2020-03-02 DIAGNOSIS — I6622 Occlusion and stenosis of left posterior cerebral artery: Secondary | ICD-10-CM | POA: Diagnosis present

## 2020-03-02 DIAGNOSIS — Z9049 Acquired absence of other specified parts of digestive tract: Secondary | ICD-10-CM | POA: Diagnosis not present

## 2020-03-02 DIAGNOSIS — Z79899 Other long term (current) drug therapy: Secondary | ICD-10-CM | POA: Diagnosis not present

## 2020-03-02 DIAGNOSIS — F0390 Unspecified dementia without behavioral disturbance: Secondary | ICD-10-CM

## 2020-03-02 DIAGNOSIS — Z888 Allergy status to other drugs, medicaments and biological substances status: Secondary | ICD-10-CM | POA: Diagnosis not present

## 2020-03-02 DIAGNOSIS — I77810 Thoracic aortic ectasia: Secondary | ICD-10-CM | POA: Diagnosis present

## 2020-03-02 DIAGNOSIS — G459 Transient cerebral ischemic attack, unspecified: Secondary | ICD-10-CM | POA: Diagnosis present

## 2020-03-02 DIAGNOSIS — R55 Syncope and collapse: Secondary | ICD-10-CM | POA: Diagnosis not present

## 2020-03-02 DIAGNOSIS — R531 Weakness: Secondary | ICD-10-CM | POA: Diagnosis present

## 2020-03-02 DIAGNOSIS — H5461 Unqualified visual loss, right eye, normal vision left eye: Secondary | ICD-10-CM | POA: Diagnosis present

## 2020-03-02 DIAGNOSIS — Y92009 Unspecified place in unspecified non-institutional (private) residence as the place of occurrence of the external cause: Secondary | ICD-10-CM | POA: Diagnosis not present

## 2020-03-02 DIAGNOSIS — I951 Orthostatic hypotension: Secondary | ICD-10-CM | POA: Diagnosis present

## 2020-03-02 DIAGNOSIS — Z66 Do not resuscitate: Secondary | ICD-10-CM | POA: Diagnosis present

## 2020-03-02 DIAGNOSIS — Z800 Family history of malignant neoplasm of digestive organs: Secondary | ICD-10-CM | POA: Diagnosis not present

## 2020-03-02 DIAGNOSIS — E785 Hyperlipidemia, unspecified: Secondary | ICD-10-CM | POA: Diagnosis present

## 2020-03-02 DIAGNOSIS — R4781 Slurred speech: Secondary | ICD-10-CM | POA: Diagnosis present

## 2020-03-02 DIAGNOSIS — R4182 Altered mental status, unspecified: Secondary | ICD-10-CM | POA: Diagnosis present

## 2020-03-02 DIAGNOSIS — I10 Essential (primary) hypertension: Secondary | ICD-10-CM | POA: Diagnosis present

## 2020-03-02 DIAGNOSIS — W19XXXA Unspecified fall, initial encounter: Secondary | ICD-10-CM | POA: Diagnosis present

## 2020-03-02 DIAGNOSIS — Z8249 Family history of ischemic heart disease and other diseases of the circulatory system: Secondary | ICD-10-CM | POA: Diagnosis not present

## 2020-03-02 DIAGNOSIS — I672 Cerebral atherosclerosis: Secondary | ICD-10-CM | POA: Diagnosis present

## 2020-03-02 DIAGNOSIS — Z20822 Contact with and (suspected) exposure to covid-19: Secondary | ICD-10-CM | POA: Diagnosis present

## 2020-03-02 DIAGNOSIS — R29703 NIHSS score 3: Secondary | ICD-10-CM | POA: Diagnosis present

## 2020-03-02 LAB — CBG MONITORING, ED: Glucose-Capillary: 90 mg/dL (ref 70–99)

## 2020-03-02 MED ORDER — SODIUM CHLORIDE 0.9 % IV SOLN: INTRAVENOUS | Status: DC

## 2020-03-02 NOTE — Progress Notes (Addendum)
Progress Note    Marisa Hall  ZOX:[id number] DOB: 01/18/1941  DOA: 03/01/2020 PCP: Greig Right, MD    Brief Narrative:     Medical records reviewed and are as summarized below:  Marisa Hall is an 79 y.o. female with medical history significant of dementia, hypertension, orthostatic hypotension, hyperlipidemia who presents after a fall at home.  Patient has had episodes of orthostatic hypotension ongoing since April (her blood pressure medication was reduced at that time).  She did have an evaluation which included a normal TSH in April and an echo in May for this.  She has had several episodes that mostly consisted of feeling lightheaded or dizzy upon standing for the last several months.  Today she had a more significant episode where she was lightheaded took a rest and instead of becoming lightheaded again and then had a fall.  Family notices some eye fluttering, but no shaking.  When seen by EMS patient was noted to have poor memory (consistent with her dementia).    Assessment/Plan:   Active Problems:   Orthostatic hypotension   Hypertension   Syncope and collapse   Orthostatic hypotension Syncope and collapse - Patient has a history of orthostatic hypotension which has been worked up by her cardiologist - Present following and more severe episode of syncope and collapse after which she was noticed to have eye twitching, slurred speech, and some possible weakness; but was returned to baseline in ED - MRI negative for cva - Denies preceding symptoms however does have significant dementia - continue to await orthostatics- ordered x 2 -IVF as urine concentrated -? Related to dementia -TED hose -may need abdominal binder -neurology consult appreciated: discussed via chat with Dr.  Lorrin Goodell and no need for optho exam (her opthalmologist is Herbert Deaner).  Will need treatment for orthostatic hypotension.  No further neurology work up required  Dementia - Continue  home memantine, donepezil - Continue BuSpar, duloxetine - Delirium precautions  Hypertension - Holding home medication for now as she may need a reduced dose given her persistent orthostatic hypotension potential leading to above syncopal event -may need permissive supine HTN  3.7 cm thyroid mass on CT -will need follow-up ultrasound   Family Communication/Anticipated D/C date and plan/Code Status   DVT prophylaxis: Lovenox ordered. Code Status: DNR  Family Communication: daughter at bedside Disposition Plan: Status is: Observation  The patient will require care spanning > 2 midnights and should be moved to inpatient because: Inpatient level of care appropriate due to severity of illness  Dispo: The patient is from: Home              Anticipated d/c is to: Home              Anticipated d/c date is: 1 day              Patient currently is not medically stable to d/c.- need IVF, orthostatics         Medical Consultants:    neurology  Subjective:   Daughter states patient does not drink enough fluid  Objective:    Vitals:   03/02/20 0030 03/02/20 0700 03/02/20 0906 03/02/20 1253  BP: (!) 174/71 (!) 168/75 (!) 185/80 (!) 182/89  Pulse: 61 67 65 74  Resp: 11 12 16 16   Temp:   98.2 F (36.8 C)   TempSrc:   Oral   SpO2: 96% 97% 97% 100%  Weight:      Height:  No intake or output data in the 24 hours ending 03/02/20 1351 Filed Weights   03/01/20 1600 03/01/20 1703  Weight: 74.2 kg 74.2 kg    Exam:  General: Appearance:     Overweight female in no acute distress     Lungs:      respirations unlabored  Heart:    Normal heart rate. Normal rhythm. No murmurs, rubs, or gallops.   MS:   All extremities are intact.   Neurologic:   Will respond to questions but not always appropriately.     Data Reviewed:   I have personally reviewed following labs and imaging studies:  Labs: Labs show the following:   Basic Metabolic Panel: Recent Labs  Lab  03/01/20 1654  NA 138  K 4.2  CL 105  CO2 25  GLUCOSE 120*  BUN 21  CREATININE 1.33*  CALCIUM 9.5   GFR Estimated Creatinine Clearance: 34.4 mL/min (A) (by C-G formula based on SCr of 1.33 mg/dL (H)). Liver Function Tests: Recent Labs  Lab 03/01/20 1654  AST 32  ALT 19  ALKPHOS 60  BILITOT 0.7  PROT 5.8*  ALBUMIN 3.5   No results for input(s): LIPASE, AMYLASE in the last 168 hours. No results for input(s): AMMONIA in the last 168 hours. Coagulation profile Recent Labs  Lab 03/01/20 1654  INR 1.1    CBC: Recent Labs  Lab 03/01/20 1654  WBC 5.9  NEUTROABS 4.3  HGB 13.6  HCT 43.1  MCV 98.9  PLT 208   Cardiac Enzymes: No results for input(s): CKTOTAL, CKMB, CKMBINDEX, TROPONINI in the last 168 hours. BNP (last 3 results) No results for input(s): PROBNP in the last 8760 hours. CBG: Recent Labs  Lab 03/01/20 1653 03/02/20 0503  GLUCAP 114* 90   D-Dimer: No results for input(s): DDIMER in the last 72 hours. Hgb A1c: No results for input(s): HGBA1C in the last 72 hours. Lipid Profile: No results for input(s): CHOL, HDL, LDLCALC, TRIG, CHOLHDL, LDLDIRECT in the last 72 hours. Thyroid function studies: No results for input(s): TSH, T4TOTAL, T3FREE, THYROIDAB in the last 72 hours.  Invalid input(s): FREET3 Anemia work up: No results for input(s): VITAMINB12, FOLATE, FERRITIN, TIBC, IRON, RETICCTPCT in the last 72 hours. Sepsis Labs: Recent Labs  Lab 03/01/20 1654  WBC 5.9    Microbiology Recent Results (from the past 240 hour(s))  Respiratory Panel by RT PCR (Flu A&B, Covid) - Nasopharyngeal Swab     Status: None   Collection Time: 03/01/20  6:40 PM   Specimen: Nasopharyngeal Swab  Result Value Ref Range Status   SARS Coronavirus 2 by RT PCR NEGATIVE NEGATIVE Final    Comment: (NOTE) SARS-CoV-2 target nucleic acids are NOT DETECTED.  The SARS-CoV-2 RNA is generally detectable in upper respiratoy specimens during the acute phase of infection.  The lowest concentration of SARS-CoV-2 viral copies this assay can detect is 131 copies/mL. A negative result does not preclude SARS-Cov-2 infection and should not be used as the sole basis for treatment or other patient management decisions. A negative result may occur with  improper specimen collection/handling, submission of specimen other than nasopharyngeal swab, presence of viral mutation(s) within the areas targeted by this assay, and inadequate number of viral copies (<131 copies/mL). A negative result must be combined with clinical observations, patient history, and epidemiological information. The expected result is Negative.  Fact Sheet for Patients:  PinkCheek.be  Fact Sheet for Healthcare Providers:  GravelBags.it  This test is no t yet approved or  cleared by the [place] and  has been authorized for detection and/or diagnosis of SARS-CoV-2 by FDA under an Emergency Use Authorization (EUA). This EUA will remain  in effect (meaning this test can be used) for the duration of the COVID-19 declaration under Section 564(b)(1) of the Act, 21 U.S.C. section 360bbb-3(b)(1), unless the authorization is terminated or revoked sooner.     Influenza A by PCR NEGATIVE NEGATIVE Final   Influenza B by PCR NEGATIVE NEGATIVE Final    Comment: (NOTE) The Xpert Xpress SARS-CoV-2/FLU/RSV assay is intended as an aid in  the diagnosis of influenza from Nasopharyngeal swab specimens and  should not be used as a sole basis for treatment. Nasal washings and  aspirates are unacceptable for Xpert Xpress SARS-CoV-2/FLU/RSV  testing.  Fact Sheet for Patients: PinkCheek.be  Fact Sheet for Healthcare Providers: GravelBags.it  This test is not yet approved or cleared by the [place] FDA and  has been authorized for detection and/or diagnosis of SARS-CoV-2 by  FDA  under an Emergency Use Authorization (EUA). This EUA will remain  in effect (meaning this test can be used) for the duration of the  Covid-19 declaration under Section 564(b)(1) of the Act, 21  U.S.C. section 360bbb-3(b)(1), unless the authorization is  terminated or revoked. Performed at [facility], [place] [address], [place], [place] [address]     Procedures and diagnostic studies:  MR BRAIN WO CONTRAST  Result Date: 03/01/2020 CLINICAL DATA:  79 year old female with dementia. Right side visual defect. Code stroke presentation. Intracranial atherosclerosis on CTA. EXAM: MRI HEAD WITHOUT CONTRAST LIMITED TECHNIQUE: Multiplanar, multiecho pulse sequences of the brain and surrounding structures were obtained without intravenous contrast. COMPARISON:  CT head, CTA and CTP the same day. FINDINGS: Study was limited by neurology request to DWI and SWI. No restricted diffusion or evidence of acute infarction. No midline shift, mass effect, or evidence of intracranial mass lesion. No ventriculomegaly. On SWI no evidence of acute or chronic cerebral hemorrhage is identified. There is evidence of a right cerebellar developmental venous anomaly (normal variant - series 4, image 22). IMPRESSION: No evidence of acute infarct. Study discussed by telephone with Dr. Donnetta Simpers on 03/01/2020 at 1750 hours. Electronically Signed   By: Genevie Ann M.D.   On: 03/01/2020 18:03   CT CEREBRAL PERFUSION W CONTRAST  Result Date: 03/01/2020 CLINICAL DATA:  79 year old code stroke presentation, right side visual deficit. Reportedly no weakness or aphasia at this time. EXAM: CT ANGIOGRAPHY HEAD AND NECK CT PERFUSION BRAIN TECHNIQUE: Multidetector CT imaging of the head and neck was performed using the standard protocol during bolus administration of intravenous contrast. Multiplanar CT image reconstructions and MIPs were obtained to evaluate the vascular anatomy. Carotid stenosis measurements (when applicable) are  obtained utilizing NASCET criteria, using the distal internal carotid diameter as the denominator. Multiphase CT imaging of the brain was performed following IV bolus contrast injection. Subsequent parametric perfusion maps were calculated using RAPID software. CONTRAST:  167mL OMNIPAQUE IOHEXOL 350 MG/ML SOLN COMPARISON:  Plain head CT 1708 hours today. FINDINGS: CT Brain Perfusion Findings: ASPECTS: 10 CBF (<30%) Volume: NonemL Perfusion (Tmax>6.0s) volume: 2mL, inferior left temporal lobe favored to be artifact. Mismatch Volume: 86mL, favored to be artifact. Infarction Location:No core infarct detected CTA NECK Skeleton: Absent mandible dentition. Degenerative changes in the cervical and upper thoracic spine, including degenerative appearing spondylolisthesis at C7-T1 and T1-T2. No acute osseous abnormality identified. Upper chest: Negative. Other neck: At the right thoracic  inlet there is an oval heterogeneously hypodense soft tissue mass measuring up to 3.7 cm long axis (series 8, image 134 and series 6, image 139) inseparable from the right tracheoesophageal groove and also the lower pole of the right thyroid where there is evidence of a "claw sign" (series 6, image 136). Burtis Junes this is an exophytic thyroid nodule. No superimposed generalized thoracic inlet lymphadenopathy. Other neck soft tissue contours are within normal limits. Aortic arch: 3 vessel arch configuration. Mild to moderate soft and calcified arch atherosclerosis. Right carotid system: No brachiocephalic or right CCA origin stenosis despite mild plaque. Mild plaque also at the right bifurcation. No cervical right ICA stenosis. Tortuous vessel distal to the bulb, and mildly beaded appearance of the distal cervical ICA (series 11, image 18). Left carotid system: Mild left CCA plaque without stenosis. Mild to moderate soft plaque at the left ICA origin with less than 50 % stenosis with respect to the distal vessel (series 10, image 131). Tortuous  vessel with a mildly kinked appearance at the distal bulb. Mildly beaded appearance of the left ICA distal to the bulb also similar to the right side. Vertebral arteries: Mild plaque in the proximal right subclavian artery without stenosis. Normal right vertebral artery origin. Non dominant right vertebral artery is patent to the skull base without significant plaque or stenosis. Soft and calcified plaque in the proximal left subclavian artery without stenosis. Normal left vertebral artery origin. Dominant left vertebral with a tortuous V1 segment. Patent left vertebral to the skull base without significant plaque or stenosis. CTA HEAD Posterior circulation: The non dominant right vertebral is diminutive beyond the patent right PICA origin. Normal left PICA origin. There is bilateral distal vertebral plaque but no significant stenosis. Tortuous vertebrobasilar junction is patent without stenosis. Patent, tortuous basilar artery. Patent SCA and PCA origins. Posterior communicating arteries are diminutive or absent. Mild bilateral P1 segment irregularity. Mild to moderate right P2 but severe left P3 segment stenosis (series 11, image 27). Although there is no discrete PCA branch occlusion. The distal PCA branches appear fairly symmetric, irregular. Anterior circulation: Both ICA siphons are patent. Mild to moderate bilateral siphon calcified plaque, greater on the left with no hemodynamically significant stenosis. Both ophthalmic artery origins are patent and appear normal. Patent carotid termini. Normal MCA and ACA origins. Anterior communicating artery is normal. Both PCAs are mildly tortuous and irregular. But patent without significant stenosis. Right MCA M1 segment and right MCA bifurcation are patent without stenosis. Right MCA branches are mildly irregular. Left MCA M1 segment and trifurcation are patent without stenosis. Left MCA branches are mildly irregular. No left MCA branch occlusion identified. Venous  sinuses: Early contrast timing, but grossly patent. Anatomic variants: Dominant left vertebral artery. Review of the MIP images confirms the above findings IMPRESSION: 1. Negative for large vessel occlusion. No core infarct detected by CT Perfusion, with trace (3 mL) abnormal Tmax volume in the inferior left temporal lobe favored to be artifactual. 2. Positive arterial findings include: - intracranial > extracranial atherosclerosis with up to severe Left PCA stenosis in the P3 segment. - suspected bilateral cervical ICA Fibromuscular Dysplasia (FMD). - Aortic Atherosclerosis (ICD10-I70.0). 3. A 3.7 cm soft tissue mass at the right thoracic inlet mass is favored to be a large exophytic thyroid nodule. No malignant features by CT, but this meets size criteria for follow-up Thyroid Ultrasound (ref: J Am Coll Radiol. 2015 Feb;12(2): 143-50). Preliminary vascular results were discussed by telephone with Dr. Lorrin Goodell on 03/01/2020 at 1730  hours. Electronically Signed   By: Genevie Ann M.D.   On: 03/01/2020 17:50   CT HEAD CODE STROKE WO CONTRAST  Result Date: 03/01/2020 CLINICAL DATA:  Code stroke. 79 year old female with right side deficits. Recent fall. EXAM: CT HEAD WITHOUT CONTRAST TECHNIQUE: Contiguous axial images were obtained from the base of the skull through the vertex without intravenous contrast. COMPARISON:  None. FINDINGS: Brain: No ventriculomegaly. No midline shift, mass effect, or evidence of intracranial mass lesion. No acute intracranial hemorrhage identified. Fairly extensive bilateral cerebral white matter and deep gray matter nuclei hypodensity in keeping with chronic small vessel ischemic disease. No superimposed changes of acute cortically based infarct identified. No cortical encephalomalacia identified. Vascular: Calcified atherosclerosis at the skull base. No suspicious intracranial vascular hyperdensity. Skull: No acute osseous abnormality identified. Sinuses/Orbits: Visualized paranasal  sinuses and mastoids are clear. Other: No acute orbit or scalp soft tissue finding. ASPECTS [facility] Stroke Program Early CT Score) Total score (0-10 with 10 being normal): 10 IMPRESSION: 1. Evidence of advanced small vessel disease. No acute cortically based infarct or acute intracranial hemorrhage identified. 2. ASPECTS 10. 3. These results were communicated to D. Khaliqdina at 5:17 pmon 03/01/2020 by text page via the [facility] messaging system. Electronically Signed   By: Genevie Ann M.D.   On: 03/01/2020 17:18   CT ANGIO HEAD CODE STROKE  Result Date: 03/01/2020 CLINICAL DATA:  79 year old code stroke presentation, right side visual deficit. Reportedly no weakness or aphasia at this time. EXAM: CT ANGIOGRAPHY HEAD AND NECK CT PERFUSION BRAIN TECHNIQUE: Multidetector CT imaging of the head and neck was performed using the standard protocol during bolus administration of intravenous contrast. Multiplanar CT image reconstructions and MIPs were obtained to evaluate the vascular anatomy. Carotid stenosis measurements (when applicable) are obtained utilizing NASCET criteria, using the distal internal carotid diameter as the denominator. Multiphase CT imaging of the brain was performed following IV bolus contrast injection. Subsequent parametric perfusion maps were calculated using RAPID software. CONTRAST:  163mL OMNIPAQUE IOHEXOL 350 MG/ML SOLN COMPARISON:  Plain head CT 1708 hours today. FINDINGS: CT Brain Perfusion Findings: ASPECTS: 10 CBF (<30%) Volume: NonemL Perfusion (Tmax>6.0s) volume: 71mL, inferior left temporal lobe favored to be artifact. Mismatch Volume: 36mL, favored to be artifact. Infarction Location:No core infarct detected CTA NECK Skeleton: Absent mandible dentition. Degenerative changes in the cervical and upper thoracic spine, including degenerative appearing spondylolisthesis at C7-T1 and T1-T2. No acute osseous abnormality identified. Upper chest: Negative. Other neck: At the right thoracic inlet  there is an oval heterogeneously hypodense soft tissue mass measuring up to 3.7 cm long axis (series 8, image 134 and series 6, image 139) inseparable from the right tracheoesophageal groove and also the lower pole of the right thyroid where there is evidence of a "claw sign" (series 6, image 136). Burtis Junes this is an exophytic thyroid nodule. No superimposed generalized thoracic inlet lymphadenopathy. Other neck soft tissue contours are within normal limits. Aortic arch: 3 vessel arch configuration. Mild to moderate soft and calcified arch atherosclerosis. Right carotid system: No brachiocephalic or right CCA origin stenosis despite mild plaque. Mild plaque also at the right bifurcation. No cervical right ICA stenosis. Tortuous vessel distal to the bulb, and mildly beaded appearance of the distal cervical ICA (series 11, image 18). Left carotid system: Mild left CCA plaque without stenosis. Mild to moderate soft plaque at the left ICA origin with less than 50 % stenosis with respect to the distal vessel (series 10, image 131). Tortuous vessel  with a mildly kinked appearance at the distal bulb. Mildly beaded appearance of the left ICA distal to the bulb also similar to the right side. Vertebral arteries: Mild plaque in the proximal right subclavian artery without stenosis. Normal right vertebral artery origin. Non dominant right vertebral artery is patent to the skull base without significant plaque or stenosis. Soft and calcified plaque in the proximal left subclavian artery without stenosis. Normal left vertebral artery origin. Dominant left vertebral with a tortuous V1 segment. Patent left vertebral to the skull base without significant plaque or stenosis. CTA HEAD Posterior circulation: The non dominant right vertebral is diminutive beyond the patent right PICA origin. Normal left PICA origin. There is bilateral distal vertebral plaque but no significant stenosis. Tortuous vertebrobasilar junction is patent without  stenosis. Patent, tortuous basilar artery. Patent SCA and PCA origins. Posterior communicating arteries are diminutive or absent. Mild bilateral P1 segment irregularity. Mild to moderate right P2 but severe left P3 segment stenosis (series 11, image 27). Although there is no discrete PCA branch occlusion. The distal PCA branches appear fairly symmetric, irregular. Anterior circulation: Both ICA siphons are patent. Mild to moderate bilateral siphon calcified plaque, greater on the left with no hemodynamically significant stenosis. Both ophthalmic artery origins are patent and appear normal. Patent carotid termini. Normal MCA and ACA origins. Anterior communicating artery is normal. Both PCAs are mildly tortuous and irregular. But patent without significant stenosis. Right MCA M1 segment and right MCA bifurcation are patent without stenosis. Right MCA branches are mildly irregular. Left MCA M1 segment and trifurcation are patent without stenosis. Left MCA branches are mildly irregular. No left MCA branch occlusion identified. Venous sinuses: Early contrast timing, but grossly patent. Anatomic variants: Dominant left vertebral artery. Review of the MIP images confirms the above findings IMPRESSION: 1. Negative for large vessel occlusion. No core infarct detected by CT Perfusion, with trace (3 mL) abnormal Tmax volume in the inferior left temporal lobe favored to be artifactual. 2. Positive arterial findings include: - intracranial > extracranial atherosclerosis with up to severe Left PCA stenosis in the P3 segment. - suspected bilateral cervical ICA Fibromuscular Dysplasia (FMD). - Aortic Atherosclerosis (ICD10-I70.0). 3. A 3.7 cm soft tissue mass at the right thoracic inlet mass is favored to be a large exophytic thyroid nodule. No malignant features by CT, but this meets size criteria for follow-up Thyroid Ultrasound (ref: J Am Coll Radiol. 2015 Feb;12(2): 143-50). Preliminary vascular results were discussed by  telephone with Dr. Lorrin Goodell on 03/01/2020 at 1730 hours. Electronically Signed   By: Genevie Ann M.D.   On: 03/01/2020 17:50   CT ANGIO NECK CODE STROKE  Result Date: 03/01/2020 CLINICAL DATA:  79 year old code stroke presentation, right side visual deficit. Reportedly no weakness or aphasia at this time. EXAM: CT ANGIOGRAPHY HEAD AND NECK CT PERFUSION BRAIN TECHNIQUE: Multidetector CT imaging of the head and neck was performed using the standard protocol during bolus administration of intravenous contrast. Multiplanar CT image reconstructions and MIPs were obtained to evaluate the vascular anatomy. Carotid stenosis measurements (when applicable) are obtained utilizing NASCET criteria, using the distal internal carotid diameter as the denominator. Multiphase CT imaging of the brain was performed following IV bolus contrast injection. Subsequent parametric perfusion maps were calculated using RAPID software. CONTRAST:  153mL OMNIPAQUE IOHEXOL 350 MG/ML SOLN COMPARISON:  Plain head CT 1708 hours today. FINDINGS: CT Brain Perfusion Findings: ASPECTS: 10 CBF (<30%) Volume: NonemL Perfusion (Tmax>6.0s) volume: 54mL, inferior left temporal lobe favored to be artifact.  Mismatch Volume: 27mL, favored to be artifact. Infarction Location:No core infarct detected CTA NECK Skeleton: Absent mandible dentition. Degenerative changes in the cervical and upper thoracic spine, including degenerative appearing spondylolisthesis at C7-T1 and T1-T2. No acute osseous abnormality identified. Upper chest: Negative. Other neck: At the right thoracic inlet there is an oval heterogeneously hypodense soft tissue mass measuring up to 3.7 cm long axis (series 8, image 134 and series 6, image 139) inseparable from the right tracheoesophageal groove and also the lower pole of the right thyroid where there is evidence of a "claw sign" (series 6, image 136). Burtis Junes this is an exophytic thyroid nodule. No superimposed generalized thoracic inlet  lymphadenopathy. Other neck soft tissue contours are within normal limits. Aortic arch: 3 vessel arch configuration. Mild to moderate soft and calcified arch atherosclerosis. Right carotid system: No brachiocephalic or right CCA origin stenosis despite mild plaque. Mild plaque also at the right bifurcation. No cervical right ICA stenosis. Tortuous vessel distal to the bulb, and mildly beaded appearance of the distal cervical ICA (series 11, image 18). Left carotid system: Mild left CCA plaque without stenosis. Mild to moderate soft plaque at the left ICA origin with less than 50 % stenosis with respect to the distal vessel (series 10, image 131). Tortuous vessel with a mildly kinked appearance at the distal bulb. Mildly beaded appearance of the left ICA distal to the bulb also similar to the right side. Vertebral arteries: Mild plaque in the proximal right subclavian artery without stenosis. Normal right vertebral artery origin. Non dominant right vertebral artery is patent to the skull base without significant plaque or stenosis. Soft and calcified plaque in the proximal left subclavian artery without stenosis. Normal left vertebral artery origin. Dominant left vertebral with a tortuous V1 segment. Patent left vertebral to the skull base without significant plaque or stenosis. CTA HEAD Posterior circulation: The non dominant right vertebral is diminutive beyond the patent right PICA origin. Normal left PICA origin. There is bilateral distal vertebral plaque but no significant stenosis. Tortuous vertebrobasilar junction is patent without stenosis. Patent, tortuous basilar artery. Patent SCA and PCA origins. Posterior communicating arteries are diminutive or absent. Mild bilateral P1 segment irregularity. Mild to moderate right P2 but severe left P3 segment stenosis (series 11, image 27). Although there is no discrete PCA branch occlusion. The distal PCA branches appear fairly symmetric, irregular. Anterior  circulation: Both ICA siphons are patent. Mild to moderate bilateral siphon calcified plaque, greater on the left with no hemodynamically significant stenosis. Both ophthalmic artery origins are patent and appear normal. Patent carotid termini. Normal MCA and ACA origins. Anterior communicating artery is normal. Both PCAs are mildly tortuous and irregular. But patent without significant stenosis. Right MCA M1 segment and right MCA bifurcation are patent without stenosis. Right MCA branches are mildly irregular. Left MCA M1 segment and trifurcation are patent without stenosis. Left MCA branches are mildly irregular. No left MCA branch occlusion identified. Venous sinuses: Early contrast timing, but grossly patent. Anatomic variants: Dominant left vertebral artery. Review of the MIP images confirms the above findings IMPRESSION: 1. Negative for large vessel occlusion. No core infarct detected by CT Perfusion, with trace (3 mL) abnormal Tmax volume in the inferior left temporal lobe favored to be artifactual. 2. Positive arterial findings include: - intracranial > extracranial atherosclerosis with up to severe Left PCA stenosis in the P3 segment. - suspected bilateral cervical ICA Fibromuscular Dysplasia (FMD). - Aortic Atherosclerosis (ICD10-I70.0). 3. A 3.7 cm soft tissue mass at the  right thoracic inlet mass is favored to be a large exophytic thyroid nodule. No malignant features by CT, but this meets size criteria for follow-up Thyroid Ultrasound (ref: J Am Coll Radiol. 2015 Feb;12(2): 143-50). Preliminary vascular results were discussed by telephone with Dr. Lorrin Goodell on 03/01/2020 at 1730 hours. Electronically Signed   By: Genevie Ann M.D.   On: 03/01/2020 17:50    Medications:   . busPIRone  7.5 mg Oral Daily  . cholecalciferol  1,000 Units Oral Daily  . donepezil  10 mg Oral QHS  . DULoxetine  60 mg Oral Daily  . enoxaparin (LOVENOX) injection  40 mg Subcutaneous Q24H  . memantine  10 mg Oral BID  .  pantoprazole  40 mg Oral Daily  . sodium chloride flush  3 mL Intravenous Q12H  . vitamin B-12  100 mcg Oral Daily   Continuous Infusions: . sodium chloride 100 mL/hr at 03/02/20 1243     LOS: 0 days   Geradine Girt  [facility]   How to contact the [facility] Attending or Consulting provider [place] or covering provider during after hours [place], for this patient?  1. Check the care team in [facility] and look for a) attending/consulting TRH provider listed and b) the [facility] team listed 2. Log into [email or web address] and use [place]'s universal password to access. If you do not have the password, please contact the hospital operator. 3. Locate the [facility] provider you are looking for under [facility] and page to a number that you can be directly reached. 4. If you still have difficulty reaching the provider, please page the [facility] (Director on Call) for the Hospitalists listed on amion for assistance.  03/02/2020, 1:51 PM

## 2020-03-02 NOTE — ED Notes (Signed)
Walked patient to the bathroom patient did well patient is resting with family at bedside and call bell is in reach

## 2020-03-02 NOTE — Plan of Care (Signed)
  Problem: Education: Goal: Knowledge of General Education information will improve Description Including pain rating scale, medication(s)/side effects and non-pharmacologic comfort measures Outcome: Progressing   Problem: Activity: Goal: Risk for activity intolerance will decrease Outcome: Progressing   Problem: Safety: Goal: Ability to remain free from injury will improve Outcome: Progressing   

## 2020-03-03 DIAGNOSIS — I951 Orthostatic hypotension: Secondary | ICD-10-CM | POA: Diagnosis not present

## 2020-03-03 LAB — CBC
HCT: 36.6 % (ref 36.0–46.0)
Hemoglobin: 11.8 g/dL — ABNORMAL LOW (ref 12.0–15.0)
MCH: 31.1 pg (ref 26.0–34.0)
MCHC: 32.2 g/dL (ref 30.0–36.0)
MCV: 96.6 fL (ref 80.0–100.0)
Platelets: 171 10*3/uL (ref 150–400)
RBC: 3.79 MIL/uL — ABNORMAL LOW (ref 3.87–5.11)
RDW: 12.6 % (ref 11.5–15.5)
WBC: 4.7 10*3/uL (ref 4.0–10.5)
nRBC: 0 % (ref 0.0–0.2)

## 2020-03-03 LAB — BASIC METABOLIC PANEL
Anion gap: 5 (ref 5–15)
BUN: 13 mg/dL (ref 8–23)
CO2: 25 mmol/L (ref 22–32)
Calcium: 9.3 mg/dL (ref 8.9–10.3)
Chloride: 108 mmol/L (ref 98–111)
Creatinine, Ser: 1.09 mg/dL — ABNORMAL HIGH (ref 0.44–1.00)
GFR, Estimated: 49 mL/min — ABNORMAL LOW (ref >60)
Glucose, Bld: 113 mg/dL — ABNORMAL HIGH (ref 70–99)
Potassium: 3.4 mmol/L — ABNORMAL LOW (ref 3.5–5.1)
Sodium: 138 mmol/L (ref 135–145)

## 2020-03-03 LAB — GLUCOSE, CAPILLARY: Glucose-Capillary: 89 mg/dL (ref 70–99)

## 2020-03-03 MED ORDER — AMLODIPINE BESYLATE 5 MG PO TABS: 5.0000 mg | ORAL_TABLET | Freq: Every day | ORAL | Status: DC

## 2020-03-03 MED ORDER — BENAZEPRIL HCL 5 MG PO TABS
5.0000 mg | ORAL_TABLET | Freq: Every day | ORAL | Status: DC
  Administered 2020-03-03 – 2020-03-04 (×2): 5 mg via ORAL
  Filled 2020-03-03 (×2): qty 1

## 2020-03-03 MED ORDER — POTASSIUM CHLORIDE CRYS ER 20 MEQ PO TBCR
20.0000 meq | EXTENDED_RELEASE_TABLET | Freq: Once | ORAL | Status: AC
  Administered 2020-03-03: 20 meq via ORAL
  Filled 2020-03-03: qty 1

## 2020-03-03 NOTE — Plan of Care (Signed)
  Problem: Education: Goal: Knowledge of General Education information will improve Description: Including pain rating scale, medication(s)/side effects and non-pharmacologic comfort measures Outcome: Progressing   Problem: Activity: Goal: Risk for activity intolerance will decrease Outcome: Progressing   

## 2020-03-03 NOTE — Plan of Care (Signed)
  Problem: Education: Goal: Knowledge of General Education information will improve Description: Including pain rating scale, medication(s)/side effects and non-pharmacologic comfort measures Outcome: Progressing   Problem: Safety: Goal: Ability to remain free from injury will improve Outcome: Progressing   

## 2020-03-03 NOTE — TOC Initial Note (Signed)
Transition of Care [facility]) - Initial/Assessment Note    Patient Details  Name: Marisa Hall MRN: [id number] Date of Birth: 02/22/1941  Transition of Care [facility]) CM/SW Contact:    Ninfa Meeker, RN Phone Number: 03/03/2020, 1:45 PM  Clinical Narrative:    79 yr old female admitted for syncopal episodes, has hx of dementia, lives home with her husband and daughters live next door. Case manager spoke with daughter,  Butch Penny [phone] concerning discharge plan and DME. Choice for Home Health agency offered, referral called to Adela Lank, [facility] Liaison. CM contacted Freda Munro with [employer] to request RW, unfortunately there are no walkers available at this time and not sure when they will receive more. Patient's family can pick up walker at the store or walker can be delivered to patient in 3-5 days per Freda Munro. Case manager contacted Butch Penny and provided store address: [address] ,[phone] . Butch Penny states they have a medical supply store closer to the home that they will get walker from, order will need to be printed and given to her when patient is discharged. CM apologized for the inconvenience.  TOC Team will continue to monitor.        Expected Discharge Plan: [place] Barriers to Discharge: No Barriers Identified   Patient Goals and CMS Choice   CMS Medicare.gov Compare Post Acute Care list provided to:: Patient Represenative (must comment) (daughter- Reinaldo Berber) Choice offered to / list presented to : Adult Children  Expected Discharge Plan and Services Expected Discharge Plan: [place]   Discharge Planning Services: CM Consult Post Acute Care Choice: Home Health, Durable Medical Equipment Living arrangements for the past 2 months: Single Family Home                 DME Arranged: Walker rolling DME Agency: [employer] Date DME Agency Contacted: 03/03/20 Time DME Agency Contacted: [id number] Representative spoke with  at DME Agency: Freda Munro HH Arranged: PT, Nurse's Aide [place] Agency: [place] Date [place]: 03/03/20 Time [place]: 70 Representative spoke with at [place]: Adela Lank  Prior Living Arrangements/Services Living arrangements for the past 2 months: [place] with:: Spouse Patient language and need for interpreter reviewed:: Yes        Need for Family Participation in Patient Care: Yes (Comment) Care giver support system in place?: Yes (comment)   Criminal Activity/Legal Involvement Pertinent to Current Situation/Hospitalization: No - Comment as needed  Activities of Daily Living      Permission Sought/Granted                  Emotional Assessment         Alcohol / Substance Use: Not Applicable Psych Involvement: No (comment)  Admission diagnosis:  Orthostatic hypotension [I95.1] Syncope and collapse [R55] TIA (transient ischemic attack) [G45.9] Syncope, unspecified syncope type [R55] Patient Active Problem List   Diagnosis Date Noted  . Syncope and collapse 03/01/2020  . Orthostatic hypotension 09/08/2019  . Hyperlipemia 09/08/2019  . Hypertension 09/08/2019  . Chest discomfort 03/28/2018   PCP:  Greig Right, MD Pharmacy:   [facility] [place], [place] [facility] [place] [address] [place] [address] Phone: [phone] Fax: [phone]     Social Determinants of Health (SDOH) Interventions    Readmission Risk Interventions No flowsheet data found.

## 2020-03-03 NOTE — Evaluation (Addendum)
Physical Therapy Evaluation Patient Details Name: Marisa Hall MRN: [id number] DOB: May 05, 1941 Today's Date: 03/03/2020   History of Present Illness  Marisa Hall is a 79 y.o. female with medical history significant of dementia, hypertension, orthostatic hypotension, hyperlipidemia who presents after a fall at home. Patient has had episodes of orthostatic hypotension ongoing since April..   Clinical Impression  Pt co-operative and pleasant. Pt with known dementia, cognition is at baseline per family. Pt was functioning indep from mobility stand point, did not need RW, was able to dress and bath with supervision for getting in/out of shower. Pt now very shaky/tremory and unsteady with ambulation. Pt much improved with RW and was able to amb 51' with minA for walker management as pt unfamilar. Pt denies dizziness. See orthostatic BP below. Recommend HHPT with use of RW and 24/7 assist. Spoke with dtr, Butch Penny, and spouse regarding d/c recs. Dtr asking about home health aide, instructed pt to contact social work and informed RN as well. Acute PT to cont to follow.  BP in supine 193/93 BP in sitting 188/100 BP in standing 177/76 BP s/p walking 197/99    Follow Up Recommendations Home health PT;Supervision/Assistance - 24 hour    Equipment Recommendations  Rolling walker with 5" wheels    Recommendations for Other Services       Precautions / Restrictions Precautions Precautions: Fall Precaution Comments: watch BP, history of orthostatic BP Restrictions Weight Bearing Restrictions: No      Mobility  Bed Mobility Overal bed mobility: Needs Assistance Bed Mobility: Supine to Sit     Supine to sit: Supervision     General bed mobility comments: pt brought self up into long sit with HOB flat and no use of bed rails, pt able to scoot self to EOB   Transfers Overall transfer level: Needs assistance Equipment used: Rolling walker (2 wheeled) Transfers: Sit to/from  [facility] Sit to Stand: Min assist Stand pivot transfers: Min assist       General transfer comment: minA to power up and steady during transition of hands from bed to RW, posterior bias, minA for walker management during turning  Ambulation/Gait Ambulation/Gait assistance: Min assist Gait Distance (Feet): 80 Feet Assistive device: Rolling walker (2 wheeled) Gait Pattern/deviations: Decreased stride length;Step-through pattern (decreased step height) Gait velocity: dec Gait velocity interpretation: <1.31 ft/sec, indicative of household ambulator General Gait Details: attempted to amb without AD however pt very unsteady with shuffled gait pattern, pt given RW, pt with increased UE wbing on walker, pt with increased step height and length and more steady  Stairs            Wheelchair Mobility    Modified Rankin (Stroke Patients Only)       Balance Overall balance assessment: Needs assistance Sitting-balance support: Feet supported;No upper extremity supported Sitting balance-Leahy Scale: Fair Sitting balance - Comments: pt able to don socks at EOB, posterior bias Postural control: Posterior lean Standing balance support: Single extremity supported Standing balance-Leahy Scale: Fair Standing balance comment: pt able to perform pericare s/p tolieting with L UE support on RW                             Pertinent Vitals/Pain Pain Assessment: No/denies pain    Home Living Family/patient expects to be discharged to:: Private residence Living Arrangements: Spouse/significant other Available Help at Discharge: Family;Available 24 hours/day Type of Home: House Home Access: Elevator  Home Layout: One level Home Equipment: Walker - 2 wheels;Toilet riser (RW is used by spouse) Additional Comments: history provided by spouse due to patients dementia/poor memory    Prior Function Level of Independence: Needs assistance   Gait / Transfers  Assistance Needed: amb without AD  ADL's / Homemaking Assistance Needed: supervision with showering  Comments: spouse does laundry, dtrs provide meals     Hand Dominance   Dominant Hand: Right    Extremity/Trunk Assessment   Upper Extremity Assessment Upper Extremity Assessment: Generalized weakness (very tremory/shaky)    Lower Extremity Assessment Lower Extremity Assessment: Generalized weakness    Cervical / Trunk Assessment Cervical / Trunk Assessment: Kyphotic (mildly)  Communication   Communication: Expressive difficulties  Cognition Arousal/Alertness: Awake/alert Behavior During Therapy: Flat affect Overall Cognitive Status: History of cognitive impairments - at baseline                                 General Comments: h/o of dementia, poor memory, not oriented to date but thats baseline, able to follow commands but delayed response time      General Comments General comments (skin integrity, edema, etc.): assisted to the [facility], pt supervision for peri-care however required v/c's to complete    Exercises     Assessment/Plan    PT Assessment Patient needs continued PT services  PT Problem List Decreased strength;Decreased range of motion;Decreased activity tolerance;Decreased balance;Decreased mobility;Decreased cognition;Decreased knowledge of use of DME       PT Treatment Interventions DME instruction;Gait training;Functional mobility training;Therapeutic activities;Therapeutic exercise;Balance training    PT Goals (Current goals can be found in the Care Plan section)  Acute Rehab PT Goals Patient Stated Goal: didn't state PT Goal Formulation: With patient Time For Goal Achievement: 03/17/20 Potential to Achieve Goals: Good    Frequency Min 3X/week   Barriers to discharge        Co-evaluation               AM-PAC PT "6 Clicks" Mobility  Outcome Measure Help needed turning from your back to your side while in a flat bed  without using bedrails?: None Help needed moving from lying on your back to sitting on the side of a flat bed without using bedrails?: None Help needed moving to and from a bed to a chair (including a wheelchair)?: A Little Help needed standing up from a chair using your arms (e.g., wheelchair or bedside chair)?: A Little Help needed to walk in hospital room?: A Little Help needed climbing 3-5 steps with a railing? : A Lot 6 Click Score: 19    End of Session Equipment Utilized During Treatment: Gait belt Activity Tolerance: Patient tolerated treatment well Patient left: in chair;with call bell/phone within reach;with chair alarm set Nurse Communication: Mobility status PT Visit Diagnosis: Unsteadiness on feet (R26.81);Difficulty in walking, not elsewhere classified (R26.2)    Time: 3299-2426 PT Time Calculation (min) (ACUTE ONLY): 31 min   Charges:   PT Evaluation $PT Eval Moderate Complexity: 1 Mod PT Treatments $Gait Training: 8-22 mins        Kittie Plater, PT, DPT Acute Rehabilitation Services Pager #: [phone] Office #: [phone]   Berline Lopes 03/03/2020, 9:15 AM

## 2020-03-03 NOTE — Progress Notes (Signed)
PROGRESS NOTE    Marisa Hall  BBC:[id number] DOB: 1941-04-26 DOA: 03/01/2020 PCP: Greig Right, MD  Brief Narrative: 79 year old female with history of dementia, orthostatic hypotension, dyslipidemia presents from home after syncopal episode, history of frequent episodes of dizziness upon standing for several months. -Did have some eye fluttering but no shaking, evaluated by EMS and brought to the ED -Orthostatics profoundly positive yesterday, systolic blood pressure went from 190s to 140s on standing   Assessment & Plan:   Recurrent orthostatic hypotension Syncope -Previously hospitalized in April for same, had some vague neurological symptoms as well, MRI is unremarkable for acute findings -Echo with preserved EF, grade 1 diastolic dysfunction, dilated aortic root -Will need to tolerate higher supine blood pressures -Orthostatics improved today, supine and standing blood pressures in the 180-190 range will restart benazepril at 5 mg daily -Compression stockings -Caution with position changes -Physical therapy  Dementia -Continue home regimen of Cymbalta, Namenda and Aricept -Delirium precautions  Hypertension -See discussion above  Thyroid mass -Incidental, noted on imaging, recommended endocrinology follow-up for this  DVT prophylaxis: Lovenox Code Status: DNR Family Communication: Daughter at bedside Disposition Plan:  Status is: Inpatient  Remains inpatient appropriate because:Inpatient level of care appropriate due to severity of illness   Dispo: The patient is from: Home              Anticipated d/c is to: Home              Anticipated d/c date is: 1 day              Patient currently is not medically stable to d/c.  Consultants:  Procedures:   Antimicrobials:    Subjective: -Blood pressure up today, denies any complaints  Objective: Vitals:   03/02/20 2005 03/03/20 0017 03/03/20 0405 03/03/20 0928  BP: (!) 161/72 (!) 195/99 (!) 175/75     Pulse: 74 78 70 76  Resp: 14     Temp: 99.4 F (37.4 C) 98.8 F (37.1 C) 98.9 F (37.2 C) 98.8 F (37.1 C)  TempSrc: Oral Oral Oral Oral  SpO2: 93% 96% 94%   Weight:   76.1 kg   Height:        Intake/Output Summary (Last 24 hours) at 03/03/2020 1222 Last data filed at 03/03/2020 5038 Gross per 24 hour  Intake 2101.28 ml  Output 100 ml  Net 2001.28 ml   Filed Weights   03/01/20 1600 03/01/20 1703 03/03/20 0405  Weight: 74.2 kg 74.2 kg 76.1 kg    Examination:  General exam: Chronically ill elderly female, sitting up in bed, awake alert oriented to self only, pleasant, no distress  respiratory system: Clear to auscultation Cardiovascular system: S1 & S2 heard, RRR  Gastrointestinal system: Abdomen is nondistended, soft and nontender.Normal bowel sounds heard. Central nervous system: Awake and alert, oriented to self only, cognitive deficits, moves all extremities no localizing signs Extremities: No edema Skin: No rashes on exposed skin Psychiatry: Poor insight and judgment   Data Reviewed:   CBC: Recent Labs  Lab 03/01/20 1654 03/03/20 0223  WBC 5.9 4.7  NEUTROABS 4.3  --   HGB 13.6 11.8*  HCT 43.1 36.6  MCV 98.9 96.6  PLT 208 882   Basic Metabolic Panel: Recent Labs  Lab 03/01/20 1654 03/03/20 0223  NA 138 138  K 4.2 3.4*  CL 105 108  CO2 25 25  GLUCOSE 120* 113*  BUN 21 13  CREATININE 1.33* 1.09*  CALCIUM 9.5 9.3   GFR:  Estimated Creatinine Clearance: 42.5 mL/min (A) (by C-G formula based on SCr of 1.09 mg/dL (H)). Liver Function Tests: Recent Labs  Lab 03/01/20 1654  AST 32  ALT 19  ALKPHOS 60  BILITOT 0.7  PROT 5.8*  ALBUMIN 3.5   No results for input(s): LIPASE, AMYLASE in the last 168 hours. No results for input(s): AMMONIA in the last 168 hours. Coagulation Profile: Recent Labs  Lab 03/01/20 1654  INR 1.1   Cardiac Enzymes: No results for input(s): CKTOTAL, CKMB, CKMBINDEX, TROPONINI in the last 168 hours. BNP (last 3  results) No results for input(s): PROBNP in the last 8760 hours. HbA1C: No results for input(s): HGBA1C in the last 72 hours. CBG: Recent Labs  Lab 03/01/20 1653 03/02/20 0503 03/03/20 0530  GLUCAP 114* 90 89   Lipid Profile: No results for input(s): CHOL, HDL, LDLCALC, TRIG, CHOLHDL, LDLDIRECT in the last 72 hours. Thyroid Function Tests: No results for input(s): TSH, T4TOTAL, FREET4, T3FREE, THYROIDAB in the last 72 hours. Anemia Panel: No results for input(s): VITAMINB12, FOLATE, FERRITIN, TIBC, IRON, RETICCTPCT in the last 72 hours. Urine analysis:    Component Value Date/Time   COLORURINE YELLOW 03/01/2020 2210   APPEARANCEUR CLEAR 03/01/2020 2210   LABSPEC >1.046 (H) 03/01/2020 2210   PHURINE 5.0 03/01/2020 2210   GLUCOSEU NEGATIVE 03/01/2020 2210   HGBUR NEGATIVE 03/01/2020 2210   BILIRUBINUR NEGATIVE 03/01/2020 2210   KETONESUR 5 (A) 03/01/2020 2210   PROTEINUR NEGATIVE 03/01/2020 2210   NITRITE NEGATIVE 03/01/2020 2210   LEUKOCYTESUR NEGATIVE 03/01/2020 2210   Sepsis Labs: @LABRCNTIP (procalcitonin:4,lacticidven:4)  ) Recent Results (from the past 240 hour(s))  Respiratory Panel by RT PCR (Flu A&B, Covid) - Nasopharyngeal Swab     Status: None   Collection Time: 03/01/20  6:40 PM   Specimen: Nasopharyngeal Swab  Result Value Ref Range Status   SARS Coronavirus 2 by RT PCR NEGATIVE NEGATIVE Final    Comment: (NOTE) SARS-CoV-2 target nucleic acids are NOT DETECTED.  The SARS-CoV-2 RNA is generally detectable in upper respiratoy specimens during the acute phase of infection. The lowest concentration of SARS-CoV-2 viral copies this assay can detect is 131 copies/mL. A negative result does not preclude SARS-Cov-2 infection and should not be used as the sole basis for treatment or other patient management decisions. A negative result may occur with  improper specimen collection/handling, submission of specimen other than nasopharyngeal swab, presence of viral  mutation(s) within the areas targeted by this assay, and inadequate number of viral copies (<131 copies/mL). A negative result must be combined with clinical observations, patient history, and epidemiological information. The expected result is Negative.  Fact Sheet for Patients:  PinkCheek.be  Fact Sheet for Healthcare Providers:  GravelBags.it  This test is no t yet approved or cleared by the [place] FDA and  has been authorized for detection and/or diagnosis of SARS-CoV-2 by FDA under an Emergency Use Authorization (EUA). This EUA will remain  in effect (meaning this test can be used) for the duration of the COVID-19 declaration under Section 564(b)(1) of the Act, 21 U.S.C. section 360bbb-3(b)(1), unless the authorization is terminated or revoked sooner.     Influenza A by PCR NEGATIVE NEGATIVE Final   Influenza B by PCR NEGATIVE NEGATIVE Final    Comment: (NOTE) The Xpert Xpress SARS-CoV-2/FLU/RSV assay is intended as an aid in  the diagnosis of influenza from Nasopharyngeal swab specimens and  should not be used as a sole basis for treatment. Nasal washings and  aspirates are  unacceptable for Xpert Xpress SARS-CoV-2/FLU/RSV  testing.  Fact Sheet for Patients: PinkCheek.be  Fact Sheet for Healthcare Providers: GravelBags.it  This test is not yet approved or cleared by the [place] FDA and  has been authorized for detection and/or diagnosis of SARS-CoV-2 by  FDA under an Emergency Use Authorization (EUA). This EUA will remain  in effect (meaning this test can be used) for the duration of the  Covid-19 declaration under Section 564(b)(1) of the Act, 21  U.S.C. section 360bbb-3(b)(1), unless the authorization is  terminated or revoked. Performed at [facility], [place] [address] [place], [place] [address]     Radiology Studies: MR BRAIN WO  CONTRAST  Result Date: 03/01/2020 CLINICAL DATA:  79 year old female with dementia. Right side visual defect. Code stroke presentation. Intracranial atherosclerosis on CTA. EXAM: MRI HEAD WITHOUT CONTRAST LIMITED TECHNIQUE: Multiplanar, multiecho pulse sequences of the brain and surrounding structures were obtained without intravenous contrast. COMPARISON:  CT head, CTA and CTP the same day. FINDINGS: Study was limited by neurology request to DWI and SWI. No restricted diffusion or evidence of acute infarction. No midline shift, mass effect, or evidence of intracranial mass lesion. No ventriculomegaly. On SWI no evidence of acute or chronic cerebral hemorrhage is identified. There is evidence of a right cerebellar developmental venous anomaly (normal variant - series 4, image 22). IMPRESSION: No evidence of acute infarct. Study discussed by telephone with Dr. Donnetta Simpers on 03/01/2020 at 1750 hours. Electronically Signed   By: Genevie Ann M.D.   On: 03/01/2020 18:03   CT CEREBRAL PERFUSION W CONTRAST  Result Date: 03/01/2020 CLINICAL DATA:  79 year old code stroke presentation, right side visual deficit. Reportedly no weakness or aphasia at this time. EXAM: CT ANGIOGRAPHY HEAD AND NECK CT PERFUSION BRAIN TECHNIQUE: Multidetector CT imaging of the head and neck was performed using the standard protocol during bolus administration of intravenous contrast. Multiplanar CT image reconstructions and MIPs were obtained to evaluate the vascular anatomy. Carotid stenosis measurements (when applicable) are obtained utilizing NASCET criteria, using the distal internal carotid diameter as the denominator. Multiphase CT imaging of the brain was performed following IV bolus contrast injection. Subsequent parametric perfusion maps were calculated using RAPID software. CONTRAST:  153mL OMNIPAQUE IOHEXOL 350 MG/ML SOLN COMPARISON:  Plain head CT 1708 hours today. FINDINGS: CT Brain Perfusion Findings: ASPECTS: 10 CBF  (<30%) Volume: NonemL Perfusion (Tmax>6.0s) volume: 22mL, inferior left temporal lobe favored to be artifact. Mismatch Volume: 39mL, favored to be artifact. Infarction Location:No core infarct detected CTA NECK Skeleton: Absent mandible dentition. Degenerative changes in the cervical and upper thoracic spine, including degenerative appearing spondylolisthesis at C7-T1 and T1-T2. No acute osseous abnormality identified. Upper chest: Negative. Other neck: At the right thoracic inlet there is an oval heterogeneously hypodense soft tissue mass measuring up to 3.7 cm long axis (series 8, image 134 and series 6, image 139) inseparable from the right tracheoesophageal groove and also the lower pole of the right thyroid where there is evidence of a "claw sign" (series 6, image 136). Burtis Junes this is an exophytic thyroid nodule. No superimposed generalized thoracic inlet lymphadenopathy. Other neck soft tissue contours are within normal limits. Aortic arch: 3 vessel arch configuration. Mild to moderate soft and calcified arch atherosclerosis. Right carotid system: No brachiocephalic or right CCA origin stenosis despite mild plaque. Mild plaque also at the right bifurcation. No cervical right ICA stenosis. Tortuous vessel distal to the bulb, and mildly beaded appearance of the distal cervical ICA (  series 11, image 18). Left carotid system: Mild left CCA plaque without stenosis. Mild to moderate soft plaque at the left ICA origin with less than 50 % stenosis with respect to the distal vessel (series 10, image 131). Tortuous vessel with a mildly kinked appearance at the distal bulb. Mildly beaded appearance of the left ICA distal to the bulb also similar to the right side. Vertebral arteries: Mild plaque in the proximal right subclavian artery without stenosis. Normal right vertebral artery origin. Non dominant right vertebral artery is patent to the skull base without significant plaque or stenosis. Soft and calcified plaque in the  proximal left subclavian artery without stenosis. Normal left vertebral artery origin. Dominant left vertebral with a tortuous V1 segment. Patent left vertebral to the skull base without significant plaque or stenosis. CTA HEAD Posterior circulation: The non dominant right vertebral is diminutive beyond the patent right PICA origin. Normal left PICA origin. There is bilateral distal vertebral plaque but no significant stenosis. Tortuous vertebrobasilar junction is patent without stenosis. Patent, tortuous basilar artery. Patent SCA and PCA origins. Posterior communicating arteries are diminutive or absent. Mild bilateral P1 segment irregularity. Mild to moderate right P2 but severe left P3 segment stenosis (series 11, image 27). Although there is no discrete PCA branch occlusion. The distal PCA branches appear fairly symmetric, irregular. Anterior circulation: Both ICA siphons are patent. Mild to moderate bilateral siphon calcified plaque, greater on the left with no hemodynamically significant stenosis. Both ophthalmic artery origins are patent and appear normal. Patent carotid termini. Normal MCA and ACA origins. Anterior communicating artery is normal. Both PCAs are mildly tortuous and irregular. But patent without significant stenosis. Right MCA M1 segment and right MCA bifurcation are patent without stenosis. Right MCA branches are mildly irregular. Left MCA M1 segment and trifurcation are patent without stenosis. Left MCA branches are mildly irregular. No left MCA branch occlusion identified. Venous sinuses: Early contrast timing, but grossly patent. Anatomic variants: Dominant left vertebral artery. Review of the MIP images confirms the above findings IMPRESSION: 1. Negative for large vessel occlusion. No core infarct detected by CT Perfusion, with trace (3 mL) abnormal Tmax volume in the inferior left temporal lobe favored to be artifactual. 2. Positive arterial findings include: - intracranial >  extracranial atherosclerosis with up to severe Left PCA stenosis in the P3 segment. - suspected bilateral cervical ICA Fibromuscular Dysplasia (FMD). - Aortic Atherosclerosis (ICD10-I70.0). 3. A 3.7 cm soft tissue mass at the right thoracic inlet mass is favored to be a large exophytic thyroid nodule. No malignant features by CT, but this meets size criteria for follow-up Thyroid Ultrasound (ref: J Am Coll Radiol. 2015 Feb;12(2): 143-50). Preliminary vascular results were discussed by telephone with Dr. Lorrin Goodell on 03/01/2020 at 1730 hours. Electronically Signed   By: Genevie Ann M.D.   On: 03/01/2020 17:50   CT HEAD CODE STROKE WO CONTRAST  Result Date: 03/01/2020 CLINICAL DATA:  Code stroke. 79 year old female with right side deficits. Recent fall. EXAM: CT HEAD WITHOUT CONTRAST TECHNIQUE: Contiguous axial images were obtained from the base of the skull through the vertex without intravenous contrast. COMPARISON:  None. FINDINGS: Brain: No ventriculomegaly. No midline shift, mass effect, or evidence of intracranial mass lesion. No acute intracranial hemorrhage identified. Fairly extensive bilateral cerebral white matter and deep gray matter nuclei hypodensity in keeping with chronic small vessel ischemic disease. No superimposed changes of acute cortically based infarct identified. No cortical encephalomalacia identified. Vascular: Calcified atherosclerosis at the skull base. No suspicious intracranial  vascular hyperdensity. Skull: No acute osseous abnormality identified. Sinuses/Orbits: Visualized paranasal sinuses and mastoids are clear. Other: No acute orbit or scalp soft tissue finding. ASPECTS [facility] Stroke Program Early CT Score) Total score (0-10 with 10 being normal): 10 IMPRESSION: 1. Evidence of advanced small vessel disease. No acute cortically based infarct or acute intracranial hemorrhage identified. 2. ASPECTS 10. 3. These results were communicated to D. Khaliqdina at 5:17 pmon 03/01/2020 by  text page via the [facility] messaging system. Electronically Signed   By: Genevie Ann M.D.   On: 03/01/2020 17:18   CT ANGIO HEAD CODE STROKE  Result Date: 03/01/2020 CLINICAL DATA:  79 year old code stroke presentation, right side visual deficit. Reportedly no weakness or aphasia at this time. EXAM: CT ANGIOGRAPHY HEAD AND NECK CT PERFUSION BRAIN TECHNIQUE: Multidetector CT imaging of the head and neck was performed using the standard protocol during bolus administration of intravenous contrast. Multiplanar CT image reconstructions and MIPs were obtained to evaluate the vascular anatomy. Carotid stenosis measurements (when applicable) are obtained utilizing NASCET criteria, using the distal internal carotid diameter as the denominator. Multiphase CT imaging of the brain was performed following IV bolus contrast injection. Subsequent parametric perfusion maps were calculated using RAPID software. CONTRAST:  161mL OMNIPAQUE IOHEXOL 350 MG/ML SOLN COMPARISON:  Plain head CT 1708 hours today. FINDINGS: CT Brain Perfusion Findings: ASPECTS: 10 CBF (<30%) Volume: NonemL Perfusion (Tmax>6.0s) volume: 36mL, inferior left temporal lobe favored to be artifact. Mismatch Volume: 55mL, favored to be artifact. Infarction Location:No core infarct detected CTA NECK Skeleton: Absent mandible dentition. Degenerative changes in the cervical and upper thoracic spine, including degenerative appearing spondylolisthesis at C7-T1 and T1-T2. No acute osseous abnormality identified. Upper chest: Negative. Other neck: At the right thoracic inlet there is an oval heterogeneously hypodense soft tissue mass measuring up to 3.7 cm long axis (series 8, image 134 and series 6, image 139) inseparable from the right tracheoesophageal groove and also the lower pole of the right thyroid where there is evidence of a "claw sign" (series 6, image 136). Burtis Junes this is an exophytic thyroid nodule. No superimposed generalized thoracic inlet lymphadenopathy. Other  neck soft tissue contours are within normal limits. Aortic arch: 3 vessel arch configuration. Mild to moderate soft and calcified arch atherosclerosis. Right carotid system: No brachiocephalic or right CCA origin stenosis despite mild plaque. Mild plaque also at the right bifurcation. No cervical right ICA stenosis. Tortuous vessel distal to the bulb, and mildly beaded appearance of the distal cervical ICA (series 11, image 18). Left carotid system: Mild left CCA plaque without stenosis. Mild to moderate soft plaque at the left ICA origin with less than 50 % stenosis with respect to the distal vessel (series 10, image 131). Tortuous vessel with a mildly kinked appearance at the distal bulb. Mildly beaded appearance of the left ICA distal to the bulb also similar to the right side. Vertebral arteries: Mild plaque in the proximal right subclavian artery without stenosis. Normal right vertebral artery origin. Non dominant right vertebral artery is patent to the skull base without significant plaque or stenosis. Soft and calcified plaque in the proximal left subclavian artery without stenosis. Normal left vertebral artery origin. Dominant left vertebral with a tortuous V1 segment. Patent left vertebral to the skull base without significant plaque or stenosis. CTA HEAD Posterior circulation: The non dominant right vertebral is diminutive beyond the patent right PICA origin. Normal left PICA origin. There is bilateral distal vertebral plaque but no significant stenosis. Tortuous vertebrobasilar  junction is patent without stenosis. Patent, tortuous basilar artery. Patent SCA and PCA origins. Posterior communicating arteries are diminutive or absent. Mild bilateral P1 segment irregularity. Mild to moderate right P2 but severe left P3 segment stenosis (series 11, image 27). Although there is no discrete PCA branch occlusion. The distal PCA branches appear fairly symmetric, irregular. Anterior circulation: Both ICA siphons  are patent. Mild to moderate bilateral siphon calcified plaque, greater on the left with no hemodynamically significant stenosis. Both ophthalmic artery origins are patent and appear normal. Patent carotid termini. Normal MCA and ACA origins. Anterior communicating artery is normal. Both PCAs are mildly tortuous and irregular. But patent without significant stenosis. Right MCA M1 segment and right MCA bifurcation are patent without stenosis. Right MCA branches are mildly irregular. Left MCA M1 segment and trifurcation are patent without stenosis. Left MCA branches are mildly irregular. No left MCA branch occlusion identified. Venous sinuses: Early contrast timing, but grossly patent. Anatomic variants: Dominant left vertebral artery. Review of the MIP images confirms the above findings IMPRESSION: 1. Negative for large vessel occlusion. No core infarct detected by CT Perfusion, with trace (3 mL) abnormal Tmax volume in the inferior left temporal lobe favored to be artifactual. 2. Positive arterial findings include: - intracranial > extracranial atherosclerosis with up to severe Left PCA stenosis in the P3 segment. - suspected bilateral cervical ICA Fibromuscular Dysplasia (FMD). - Aortic Atherosclerosis (ICD10-I70.0). 3. A 3.7 cm soft tissue mass at the right thoracic inlet mass is favored to be a large exophytic thyroid nodule. No malignant features by CT, but this meets size criteria for follow-up Thyroid Ultrasound (ref: J Am Coll Radiol. 2015 Feb;12(2): 143-50). Preliminary vascular results were discussed by telephone with Dr. Lorrin Goodell on 03/01/2020 at 1730 hours. Electronically Signed   By: Genevie Ann M.D.   On: 03/01/2020 17:50   CT ANGIO NECK CODE STROKE  Result Date: 03/01/2020 CLINICAL DATA:  79 year old code stroke presentation, right side visual deficit. Reportedly no weakness or aphasia at this time. EXAM: CT ANGIOGRAPHY HEAD AND NECK CT PERFUSION BRAIN TECHNIQUE: Multidetector CT imaging of the head  and neck was performed using the standard protocol during bolus administration of intravenous contrast. Multiplanar CT image reconstructions and MIPs were obtained to evaluate the vascular anatomy. Carotid stenosis measurements (when applicable) are obtained utilizing NASCET criteria, using the distal internal carotid diameter as the denominator. Multiphase CT imaging of the brain was performed following IV bolus contrast injection. Subsequent parametric perfusion maps were calculated using RAPID software. CONTRAST:  138mL OMNIPAQUE IOHEXOL 350 MG/ML SOLN COMPARISON:  Plain head CT 1708 hours today. FINDINGS: CT Brain Perfusion Findings: ASPECTS: 10 CBF (<30%) Volume: NonemL Perfusion (Tmax>6.0s) volume: 7mL, inferior left temporal lobe favored to be artifact. Mismatch Volume: 58mL, favored to be artifact. Infarction Location:No core infarct detected CTA NECK Skeleton: Absent mandible dentition. Degenerative changes in the cervical and upper thoracic spine, including degenerative appearing spondylolisthesis at C7-T1 and T1-T2. No acute osseous abnormality identified. Upper chest: Negative. Other neck: At the right thoracic inlet there is an oval heterogeneously hypodense soft tissue mass measuring up to 3.7 cm long axis (series 8, image 134 and series 6, image 139) inseparable from the right tracheoesophageal groove and also the lower pole of the right thyroid where there is evidence of a "claw sign" (series 6, image 136). Burtis Junes this is an exophytic thyroid nodule. No superimposed generalized thoracic inlet lymphadenopathy. Other neck soft tissue contours are within normal limits. Aortic arch: 3 vessel arch  configuration. Mild to moderate soft and calcified arch atherosclerosis. Right carotid system: No brachiocephalic or right CCA origin stenosis despite mild plaque. Mild plaque also at the right bifurcation. No cervical right ICA stenosis. Tortuous vessel distal to the bulb, and mildly beaded appearance of the  distal cervical ICA (series 11, image 18). Left carotid system: Mild left CCA plaque without stenosis. Mild to moderate soft plaque at the left ICA origin with less than 50 % stenosis with respect to the distal vessel (series 10, image 131). Tortuous vessel with a mildly kinked appearance at the distal bulb. Mildly beaded appearance of the left ICA distal to the bulb also similar to the right side. Vertebral arteries: Mild plaque in the proximal right subclavian artery without stenosis. Normal right vertebral artery origin. Non dominant right vertebral artery is patent to the skull base without significant plaque or stenosis. Soft and calcified plaque in the proximal left subclavian artery without stenosis. Normal left vertebral artery origin. Dominant left vertebral with a tortuous V1 segment. Patent left vertebral to the skull base without significant plaque or stenosis. CTA HEAD Posterior circulation: The non dominant right vertebral is diminutive beyond the patent right PICA origin. Normal left PICA origin. There is bilateral distal vertebral plaque but no significant stenosis. Tortuous vertebrobasilar junction is patent without stenosis. Patent, tortuous basilar artery. Patent SCA and PCA origins. Posterior communicating arteries are diminutive or absent. Mild bilateral P1 segment irregularity. Mild to moderate right P2 but severe left P3 segment stenosis (series 11, image 27). Although there is no discrete PCA branch occlusion. The distal PCA branches appear fairly symmetric, irregular. Anterior circulation: Both ICA siphons are patent. Mild to moderate bilateral siphon calcified plaque, greater on the left with no hemodynamically significant stenosis. Both ophthalmic artery origins are patent and appear normal. Patent carotid termini. Normal MCA and ACA origins. Anterior communicating artery is normal. Both PCAs are mildly tortuous and irregular. But patent without significant stenosis. Right MCA M1 segment  and right MCA bifurcation are patent without stenosis. Right MCA branches are mildly irregular. Left MCA M1 segment and trifurcation are patent without stenosis. Left MCA branches are mildly irregular. No left MCA branch occlusion identified. Venous sinuses: Early contrast timing, but grossly patent. Anatomic variants: Dominant left vertebral artery. Review of the MIP images confirms the above findings IMPRESSION: 1. Negative for large vessel occlusion. No core infarct detected by CT Perfusion, with trace (3 mL) abnormal Tmax volume in the inferior left temporal lobe favored to be artifactual. 2. Positive arterial findings include: - intracranial > extracranial atherosclerosis with up to severe Left PCA stenosis in the P3 segment. - suspected bilateral cervical ICA Fibromuscular Dysplasia (FMD). - Aortic Atherosclerosis (ICD10-I70.0). 3. A 3.7 cm soft tissue mass at the right thoracic inlet mass is favored to be a large exophytic thyroid nodule. No malignant features by CT, but this meets size criteria for follow-up Thyroid Ultrasound (ref: J Am Coll Radiol. 2015 Feb;12(2): 143-50). Preliminary vascular results were discussed by telephone with Dr. Lorrin Goodell on 03/01/2020 at 1730 hours. Electronically Signed   By: Genevie Ann M.D.   On: 03/01/2020 17:50        Scheduled Meds: . benazepril  5 mg Oral Daily  . busPIRone  7.5 mg Oral Daily  . cholecalciferol  1,000 Units Oral Daily  . donepezil  10 mg Oral QHS  . DULoxetine  60 mg Oral Daily  . enoxaparin (LOVENOX) injection  40 mg Subcutaneous Q24H  . memantine  10 mg Oral  BID  . pantoprazole  40 mg Oral Daily  . potassium chloride  20 mEq Oral Once  . sodium chloride flush  3 mL Intravenous Q12H  . vitamin B-12  100 mcg Oral Daily   Continuous Infusions:   LOS: 1 day    Time spent: 33min  Domenic Polite, MD [facility]  03/03/2020, 12:22 PM

## 2020-03-04 DIAGNOSIS — I951 Orthostatic hypotension: Secondary | ICD-10-CM | POA: Diagnosis not present

## 2020-03-04 MED ORDER — UNABLE TO FIND: 0 refills | Status: DC

## 2020-03-04 NOTE — Progress Notes (Signed)
Pt ambulated in the room and in hallway, about 50 feet in the hallway and in room. MD Broadus John aware.

## 2020-03-04 NOTE — TOC Transition Note (Addendum)
Transition of Care [facility]) - CM/SW Discharge Note   Patient Details  Name: EULALIA ELLERMAN MRN: [id number] Date of Birth: Feb 02, 1941  [facility] CM/SW Contact:  Bartholomew Crews, RN Phone Number: [phone] 03/04/2020, 12:03 PM   Clinical Narrative:     Spoke with patient and her daughter, Butch Penny, at the bedside to review transition plans. HH orders in place per MD. Alvis Lemmings notified of transition home today. Discussed having RW shipped to home, however, RW are now in stock and will be delivered to the bedside. Butch Penny to provide transportation home. No problems with obtaining prescription medications or following up with medical appointments. Butch Penny provided copies of patient's HCPOA and Living Will. No further TOC needs identified at this time.   Final next level of care: [place] Barriers to Discharge: No Barriers Identified   Patient Goals and CMS Choice Patient states their goals for this hospitalization and ongoing recovery are:: return home CMS Medicare.gov Compare Post Acute Care list provided to:: Patient Choice offered to / list presented to : Patient, Adult Children Reinaldo Berber)  Discharge Placement                       Discharge Plan and Services   Discharge Planning Services: CM Consult Post Acute Care Choice: Home Health, Durable Medical Equipment          DME Arranged: [place] rolling DME Agency: [employer] Date DME Agency Contacted: 03/04/20 Time DME Agency Contacted: 1203 Representative spoke with at DME Agency: Freda Munro HH Arranged: PT, Nurse's Aide [place] Agency: [place] Date [place]: 03/04/20 Time [place]: 1203 Representative spoke with at [place]: [place]) Interventions     Readmission Risk Interventions No flowsheet data found.

## 2020-03-04 NOTE — Discharge Summary (Signed)
Physician Discharge Summary  Marisa DERDEN IOE:[id number] DOB: [birth year] DOA: 03/01/2020  PCP: Greig Right, MD  Admit date: 03/01/2020 Discharge date: 03/04/2020  Time spent: 35 minutes  Recommendations for Outpatient Follow-up:  1. Caution patient has severe orthostatic hypotension please refrain from titrating BP meds based on supine blood pressure readings alone 2. Home health PT 3. Thyroid mass was incidentally noted on CT, recommend follow-up with endocrinology in 1 month   Discharge Diagnoses:  Active Problems:   Orthostatic hypotension   Hypertension   Syncope Dementia Thyroid mass  Discharge Condition: Stable  Diet recommendation: Heart healthy  Filed Weights   03/01/20 1600 03/01/20 1703 03/03/20 0405  Weight: 74.2 kg 74.2 kg 76.1 kg    History of present illness:  79 year old female with history of dementia, orthostatic hypotension, dyslipidemia presents from home after syncopal episode, history of frequent episodes of dizziness upon standing for several months. -Did have some eye fluttering but no shaking, evaluated by EMS and brought to the ED -Orthostatics profoundly positive yesterday, systolic blood pressure went from 190s to 140s on standing  Hospital Course:   Recurrent orthostatic hypotension Syncope -Previously hospitalized in April for same, had some vague neurological symptoms as well, MRI is unremarkable for acute findings -Echo with preserved EF, grade 1 diastolic dysfunction, dilated aortic root -Will need to tolerate higher supine blood pressures -improved with hydration, orthostatics improved since yesterday, supine and standing blood pressures in the 180-190 range, so we restarted benazepril at 5 mg daily -Compression stockings, adequate hydration and Caution with position changes recommended -Physical therapy eval completed, home health PT recommended and set up at discharge  Dementia -Continue home regimen of Cymbalta, Namenda and  Aricept -Stable, discharged home to her family  Hypertension -See discussion above  Thyroid mass -Incidental, noted on imaging, recommended endocrinology follow-up for this  Discharge Exam: Vitals:   03/04/20 0449 03/04/20 0900  BP: (!) 150/91 (!) 152/88  Pulse: (!) 54 62  Resp: 16 18  Temp: 98.6 F (37 C) 98.2 F (36.8 C)  SpO2: 96% 98%    General: Awake alert oriented to self only Cardiovascular: S1S2/RRR Respiratory: CTAb  Discharge Instructions   Discharge Instructions    Diet - low sodium heart healthy   Complete by: As directed    Discharge instructions   Complete by: As directed    Stay well hydrated, wear thigh high compression stockings during the day, exercise caution with any position changes, try to use a walker   Increase activity slowly   Complete by: As directed      Allergies as of 03/04/2020      Reactions   Effexor Xr [venlafaxine]       Medication List    STOP taking these medications   meclizine 25 MG tablet Commonly known as: ANTIVERT     TAKE these medications   benazepril 5 MG tablet Commonly known as: LOTENSIN Take 1 tablet (5 mg total) by mouth daily.   busPIRone 7.5 MG tablet Commonly known as: BUSPAR Take 1 tablet by mouth daily.   cholecalciferol 25 MCG (1000 UNIT) tablet Commonly known as: VITAMIN D3 Take 1,000 Units by mouth daily.   dicyclomine 10 MG capsule Commonly known as: BENTYL Take 10 mg by mouth 4 (four) times daily as needed for spasms. Stomach pain   donepezil 10 MG tablet Commonly known as: ARICEPT Take 1 tablet by mouth at bedtime.   DULoxetine 60 MG capsule Commonly known as: CYMBALTA Take 60 mg by mouth daily.  memantine 10 MG tablet Commonly known as: NAMENDA Take 1 tablet by mouth 2 (two) times daily.   omeprazole 20 MG capsule Commonly known as: PRILOSEC Take 20 mg by mouth daily.   UNABLE TO FIND Rolling walker   vitamin B-12 100 MCG tablet Commonly known as: CYANOCOBALAMIN Take  100 mcg by mouth daily.            Durable Medical Equipment  (From admission, onward)         Start     Ordered   03/03/20 1359  For home use only DME Walker rolling  Once       Comments: Height: 5'4", weight: 167.8 pounds  Question Answer Comment  Walker: With 5 Inch Wheels   Patient needs a walker to treat with the following condition Generalized weakness      03/03/20 1400         Allergies  Allergen Reactions  . Effexor Xr [Venlafaxine]     Follow-up Information    Revankar, Reita Cliche, MD. Schedule an appointment as soon as possible for a visit in 2 week(s).   Specialty: Cardiology Contact information: [address] [place] [address] [phone]                The results of significant diagnostics from this hospitalization (including imaging, microbiology, ancillary and laboratory) are listed below for reference.    Significant Diagnostic Studies: MR BRAIN WO CONTRAST  Result Date: 03/01/2020 CLINICAL DATA:  79 year old female with dementia. Right side visual defect. Code stroke presentation. Intracranial atherosclerosis on CTA. EXAM: MRI HEAD WITHOUT CONTRAST LIMITED TECHNIQUE: Multiplanar, multiecho pulse sequences of the brain and surrounding structures were obtained without intravenous contrast. COMPARISON:  CT head, CTA and CTP the same day. FINDINGS: Study was limited by neurology request to DWI and SWI. No restricted diffusion or evidence of acute infarction. No midline shift, mass effect, or evidence of intracranial mass lesion. No ventriculomegaly. On SWI no evidence of acute or chronic cerebral hemorrhage is identified. There is evidence of a right cerebellar developmental venous anomaly (normal variant - series 4, image 22). IMPRESSION: No evidence of acute infarct. Study discussed by telephone with Dr. Donnetta Simpers on 03/01/2020 at 1750 hours. Electronically Signed   By: Genevie Ann M.D.   On: 03/01/2020 18:03   CT CEREBRAL PERFUSION W  CONTRAST  Result Date: 03/01/2020 CLINICAL DATA:  79 year old code stroke presentation, right side visual deficit. Reportedly no weakness or aphasia at this time. EXAM: CT ANGIOGRAPHY HEAD AND NECK CT PERFUSION BRAIN TECHNIQUE: Multidetector CT imaging of the head and neck was performed using the standard protocol during bolus administration of intravenous contrast. Multiplanar CT image reconstructions and MIPs were obtained to evaluate the vascular anatomy. Carotid stenosis measurements (when applicable) are obtained utilizing NASCET criteria, using the distal internal carotid diameter as the denominator. Multiphase CT imaging of the brain was performed following IV bolus contrast injection. Subsequent parametric perfusion maps were calculated using RAPID software. CONTRAST:  118mL OMNIPAQUE IOHEXOL 350 MG/ML SOLN COMPARISON:  Plain head CT 1708 hours today. FINDINGS: CT Brain Perfusion Findings: ASPECTS: 10 CBF (<30%) Volume: NonemL Perfusion (Tmax>6.0s) volume: 98mL, inferior left temporal lobe favored to be artifact. Mismatch Volume: 16mL, favored to be artifact. Infarction Location:No core infarct detected CTA NECK Skeleton: Absent mandible dentition. Degenerative changes in the cervical and upper thoracic spine, including degenerative appearing spondylolisthesis at C7-T1 and T1-T2. No acute osseous abnormality identified. Upper chest: Negative. Other neck: At the right thoracic  inlet there is an oval heterogeneously hypodense soft tissue mass measuring up to 3.7 cm long axis (series 8, image 134 and series 6, image 139) inseparable from the right tracheoesophageal groove and also the lower pole of the right thyroid where there is evidence of a "claw sign" (series 6, image 136). Burtis Junes this is an exophytic thyroid nodule. No superimposed generalized thoracic inlet lymphadenopathy. Other neck soft tissue contours are within normal limits. Aortic arch: 3 vessel arch configuration. Mild to moderate soft and  calcified arch atherosclerosis. Right carotid system: No brachiocephalic or right CCA origin stenosis despite mild plaque. Mild plaque also at the right bifurcation. No cervical right ICA stenosis. Tortuous vessel distal to the bulb, and mildly beaded appearance of the distal cervical ICA (series 11, image 18). Left carotid system: Mild left CCA plaque without stenosis. Mild to moderate soft plaque at the left ICA origin with less than 50 % stenosis with respect to the distal vessel (series 10, image 131). Tortuous vessel with a mildly kinked appearance at the distal bulb. Mildly beaded appearance of the left ICA distal to the bulb also similar to the right side. Vertebral arteries: Mild plaque in the proximal right subclavian artery without stenosis. Normal right vertebral artery origin. Non dominant right vertebral artery is patent to the skull base without significant plaque or stenosis. Soft and calcified plaque in the proximal left subclavian artery without stenosis. Normal left vertebral artery origin. Dominant left vertebral with a tortuous V1 segment. Patent left vertebral to the skull base without significant plaque or stenosis. CTA HEAD Posterior circulation: The non dominant right vertebral is diminutive beyond the patent right PICA origin. Normal left PICA origin. There is bilateral distal vertebral plaque but no significant stenosis. Tortuous vertebrobasilar junction is patent without stenosis. Patent, tortuous basilar artery. Patent SCA and PCA origins. Posterior communicating arteries are diminutive or absent. Mild bilateral P1 segment irregularity. Mild to moderate right P2 but severe left P3 segment stenosis (series 11, image 27). Although there is no discrete PCA branch occlusion. The distal PCA branches appear fairly symmetric, irregular. Anterior circulation: Both ICA siphons are patent. Mild to moderate bilateral siphon calcified plaque, greater on the left with no hemodynamically significant  stenosis. Both ophthalmic artery origins are patent and appear normal. Patent carotid termini. Normal MCA and ACA origins. Anterior communicating artery is normal. Both PCAs are mildly tortuous and irregular. But patent without significant stenosis. Right MCA M1 segment and right MCA bifurcation are patent without stenosis. Right MCA branches are mildly irregular. Left MCA M1 segment and trifurcation are patent without stenosis. Left MCA branches are mildly irregular. No left MCA branch occlusion identified. Venous sinuses: Early contrast timing, but grossly patent. Anatomic variants: Dominant left vertebral artery. Review of the MIP images confirms the above findings IMPRESSION: 1. Negative for large vessel occlusion. No core infarct detected by CT Perfusion, with trace (3 mL) abnormal Tmax volume in the inferior left temporal lobe favored to be artifactual. 2. Positive arterial findings include: - intracranial > extracranial atherosclerosis with up to severe Left PCA stenosis in the P3 segment. - suspected bilateral cervical ICA Fibromuscular Dysplasia (FMD). - Aortic Atherosclerosis (ICD10-I70.0). 3. A 3.7 cm soft tissue mass at the right thoracic inlet mass is favored to be a large exophytic thyroid nodule. No malignant features by CT, but this meets size criteria for follow-up Thyroid Ultrasound (ref: J Am Coll Radiol. 2015 Feb;12(2): 143-50). Preliminary vascular results were discussed by telephone with Dr. Lorrin Goodell on 03/01/2020 at 1730  hours. Electronically Signed   By: Genevie Ann M.D.   On: 03/01/2020 17:50   CT HEAD CODE STROKE WO CONTRAST  Result Date: 03/01/2020 CLINICAL DATA:  Code stroke. 79 year old female with right side deficits. Recent fall. EXAM: CT HEAD WITHOUT CONTRAST TECHNIQUE: Contiguous axial images were obtained from the base of the skull through the vertex without intravenous contrast. COMPARISON:  [facility]. FINDINGS: Brain: No ventriculomegaly. No midline shift, mass effect, or evidence  of intracranial mass lesion. No acute intracranial hemorrhage identified. Fairly extensive bilateral cerebral white matter and deep gray matter nuclei hypodensity in keeping with chronic small vessel ischemic disease. No superimposed changes of acute cortically based infarct identified. No cortical encephalomalacia identified. Vascular: Calcified atherosclerosis at the skull base. No suspicious intracranial vascular hyperdensity. Skull: No acute osseous abnormality identified. Sinuses/Orbits: Visualized paranasal sinuses and mastoids are clear. Other: No acute orbit or scalp soft tissue finding. ASPECTS [facility] Stroke Program Early CT Score) Total score (0-10 with 10 being normal): 10 IMPRESSION: 1. Evidence of advanced small vessel disease. No acute cortically based infarct or acute intracranial hemorrhage identified. 2. ASPECTS 10. 3. These results were communicated to D. Khaliqdina at 5:17 pmon 03/01/2020 by text page via the [facility] messaging system. Electronically Signed   By: Genevie Ann M.D.   On: 03/01/2020 17:18   CT ANGIO HEAD CODE STROKE  Result Date: 03/01/2020 CLINICAL DATA:  79 year old code stroke presentation, right side visual deficit. Reportedly no weakness or aphasia at this time. EXAM: CT ANGIOGRAPHY HEAD AND NECK CT PERFUSION BRAIN TECHNIQUE: Multidetector CT imaging of the head and neck was performed using the standard protocol during bolus administration of intravenous contrast. Multiplanar CT image reconstructions and MIPs were obtained to evaluate the vascular anatomy. Carotid stenosis measurements (when applicable) are obtained utilizing NASCET criteria, using the distal internal carotid diameter as the denominator. Multiphase CT imaging of the brain was performed following IV bolus contrast injection. Subsequent parametric perfusion maps were calculated using RAPID software. CONTRAST:  157mL OMNIPAQUE IOHEXOL 350 MG/ML SOLN COMPARISON:  Plain head CT 1708 hours today. FINDINGS: CT Brain  Perfusion Findings: ASPECTS: 10 CBF (<30%) Volume: NonemL Perfusion (Tmax>6.0s) volume: 69mL, inferior left temporal lobe favored to be artifact. Mismatch Volume: 45mL, favored to be artifact. Infarction Location:No core infarct detected CTA NECK Skeleton: Absent mandible dentition. Degenerative changes in the cervical and upper thoracic spine, including degenerative appearing spondylolisthesis at C7-T1 and T1-T2. No acute osseous abnormality identified. Upper chest: Negative. Other neck: At the right thoracic inlet there is an oval heterogeneously hypodense soft tissue mass measuring up to 3.7 cm long axis (series 8, image 134 and series 6, image 139) inseparable from the right tracheoesophageal groove and also the lower pole of the right thyroid where there is evidence of a "claw sign" (series 6, image 136). Burtis Junes this is an exophytic thyroid nodule. No superimposed generalized thoracic inlet lymphadenopathy. Other neck soft tissue contours are within normal limits. Aortic arch: 3 vessel arch configuration. Mild to moderate soft and calcified arch atherosclerosis. Right carotid system: No brachiocephalic or right CCA origin stenosis despite mild plaque. Mild plaque also at the right bifurcation. No cervical right ICA stenosis. Tortuous vessel distal to the bulb, and mildly beaded appearance of the distal cervical ICA (series 11, image 18). Left carotid system: Mild left CCA plaque without stenosis. Mild to moderate soft plaque at the left ICA origin with less than 50 % stenosis with respect to the distal vessel (series 10, image 131). Tortuous vessel  with a mildly kinked appearance at the distal bulb. Mildly beaded appearance of the left ICA distal to the bulb also similar to the right side. Vertebral arteries: Mild plaque in the proximal right subclavian artery without stenosis. Normal right vertebral artery origin. Non dominant right vertebral artery is patent to the skull base without significant plaque or  stenosis. Soft and calcified plaque in the proximal left subclavian artery without stenosis. Normal left vertebral artery origin. Dominant left vertebral with a tortuous V1 segment. Patent left vertebral to the skull base without significant plaque or stenosis. CTA HEAD Posterior circulation: The non dominant right vertebral is diminutive beyond the patent right PICA origin. Normal left PICA origin. There is bilateral distal vertebral plaque but no significant stenosis. Tortuous vertebrobasilar junction is patent without stenosis. Patent, tortuous basilar artery. Patent SCA and PCA origins. Posterior communicating arteries are diminutive or absent. Mild bilateral P1 segment irregularity. Mild to moderate right P2 but severe left P3 segment stenosis (series 11, image 27). Although there is no discrete PCA branch occlusion. The distal PCA branches appear fairly symmetric, irregular. Anterior circulation: Both ICA siphons are patent. Mild to moderate bilateral siphon calcified plaque, greater on the left with no hemodynamically significant stenosis. Both ophthalmic artery origins are patent and appear normal. Patent carotid termini. Normal MCA and ACA origins. Anterior communicating artery is normal. Both PCAs are mildly tortuous and irregular. But patent without significant stenosis. Right MCA M1 segment and right MCA bifurcation are patent without stenosis. Right MCA branches are mildly irregular. Left MCA M1 segment and trifurcation are patent without stenosis. Left MCA branches are mildly irregular. No left MCA branch occlusion identified. Venous sinuses: Early contrast timing, but grossly patent. Anatomic variants: Dominant left vertebral artery. Review of the MIP images confirms the above findings IMPRESSION: 1. Negative for large vessel occlusion. No core infarct detected by CT Perfusion, with trace (3 mL) abnormal Tmax volume in the inferior left temporal lobe favored to be artifactual. 2. Positive arterial  findings include: - intracranial > extracranial atherosclerosis with up to severe Left PCA stenosis in the P3 segment. - suspected bilateral cervical ICA Fibromuscular Dysplasia (FMD). - Aortic Atherosclerosis (ICD10-I70.0). 3. A 3.7 cm soft tissue mass at the right thoracic inlet mass is favored to be a large exophytic thyroid nodule. No malignant features by CT, but this meets size criteria for follow-up Thyroid Ultrasound (ref: J Am Coll Radiol. 2015 Feb;12(2): 143-50). Preliminary vascular results were discussed by telephone with Dr. Lorrin Goodell on 03/01/2020 at 1730 hours. Electronically Signed   By: Genevie Ann M.D.   On: 03/01/2020 17:50   CT ANGIO NECK CODE STROKE  Result Date: 03/01/2020 CLINICAL DATA:  79 year old code stroke presentation, right side visual deficit. Reportedly no weakness or aphasia at this time. EXAM: CT ANGIOGRAPHY HEAD AND NECK CT PERFUSION BRAIN TECHNIQUE: Multidetector CT imaging of the head and neck was performed using the standard protocol during bolus administration of intravenous contrast. Multiplanar CT image reconstructions and MIPs were obtained to evaluate the vascular anatomy. Carotid stenosis measurements (when applicable) are obtained utilizing NASCET criteria, using the distal internal carotid diameter as the denominator. Multiphase CT imaging of the brain was performed following IV bolus contrast injection. Subsequent parametric perfusion maps were calculated using RAPID software. CONTRAST:  116mL OMNIPAQUE IOHEXOL 350 MG/ML SOLN COMPARISON:  Plain head CT 1708 hours today. FINDINGS: CT Brain Perfusion Findings: ASPECTS: 10 CBF (<30%) Volume: NonemL Perfusion (Tmax>6.0s) volume: 30mL, inferior left temporal lobe favored to be artifact.  Mismatch Volume: 46mL, favored to be artifact. Infarction Location:No core infarct detected CTA NECK Skeleton: Absent mandible dentition. Degenerative changes in the cervical and upper thoracic spine, including degenerative appearing  spondylolisthesis at C7-T1 and T1-T2. No acute osseous abnormality identified. Upper chest: Negative. Other neck: At the right thoracic inlet there is an oval heterogeneously hypodense soft tissue mass measuring up to 3.7 cm long axis (series 8, image 134 and series 6, image 139) inseparable from the right tracheoesophageal groove and also the lower pole of the right thyroid where there is evidence of a "claw sign" (series 6, image 136). Burtis Junes this is an exophytic thyroid nodule. No superimposed generalized thoracic inlet lymphadenopathy. Other neck soft tissue contours are within normal limits. Aortic arch: 3 vessel arch configuration. Mild to moderate soft and calcified arch atherosclerosis. Right carotid system: No brachiocephalic or right CCA origin stenosis despite mild plaque. Mild plaque also at the right bifurcation. No cervical right ICA stenosis. Tortuous vessel distal to the bulb, and mildly beaded appearance of the distal cervical ICA (series 11, image 18). Left carotid system: Mild left CCA plaque without stenosis. Mild to moderate soft plaque at the left ICA origin with less than 50 % stenosis with respect to the distal vessel (series 10, image 131). Tortuous vessel with a mildly kinked appearance at the distal bulb. Mildly beaded appearance of the left ICA distal to the bulb also similar to the right side. Vertebral arteries: Mild plaque in the proximal right subclavian artery without stenosis. Normal right vertebral artery origin. Non dominant right vertebral artery is patent to the skull base without significant plaque or stenosis. Soft and calcified plaque in the proximal left subclavian artery without stenosis. Normal left vertebral artery origin. Dominant left vertebral with a tortuous V1 segment. Patent left vertebral to the skull base without significant plaque or stenosis. CTA HEAD Posterior circulation: The non dominant right vertebral is diminutive beyond the patent right PICA origin. Normal  left PICA origin. There is bilateral distal vertebral plaque but no significant stenosis. Tortuous vertebrobasilar junction is patent without stenosis. Patent, tortuous basilar artery. Patent SCA and PCA origins. Posterior communicating arteries are diminutive or absent. Mild bilateral P1 segment irregularity. Mild to moderate right P2 but severe left P3 segment stenosis (series 11, image 27). Although there is no discrete PCA branch occlusion. The distal PCA branches appear fairly symmetric, irregular. Anterior circulation: Both ICA siphons are patent. Mild to moderate bilateral siphon calcified plaque, greater on the left with no hemodynamically significant stenosis. Both ophthalmic artery origins are patent and appear normal. Patent carotid termini. Normal MCA and ACA origins. Anterior communicating artery is normal. Both PCAs are mildly tortuous and irregular. But patent without significant stenosis. Right MCA M1 segment and right MCA bifurcation are patent without stenosis. Right MCA branches are mildly irregular. Left MCA M1 segment and trifurcation are patent without stenosis. Left MCA branches are mildly irregular. No left MCA branch occlusion identified. Venous sinuses: Early contrast timing, but grossly patent. Anatomic variants: Dominant left vertebral artery. Review of the MIP images confirms the above findings IMPRESSION: 1. Negative for large vessel occlusion. No core infarct detected by CT Perfusion, with trace (3 mL) abnormal Tmax volume in the inferior left temporal lobe favored to be artifactual. 2. Positive arterial findings include: - intracranial > extracranial atherosclerosis with up to severe Left PCA stenosis in the P3 segment. - suspected bilateral cervical ICA Fibromuscular Dysplasia (FMD). - Aortic Atherosclerosis (ICD10-I70.0). 3. A 3.7 cm soft tissue mass at the  right thoracic inlet mass is favored to be a large exophytic thyroid nodule. No malignant features by CT, but this meets size  criteria for follow-up Thyroid Ultrasound (ref: J Am Coll Radiol. 2015 Feb;12(2): 143-50). Preliminary vascular results were discussed by telephone with Dr. Lorrin Goodell on 03/01/2020 at 1730 hours. Electronically Signed   By: Genevie Ann M.D.   On: 03/01/2020 17:50    Microbiology: Recent Results (from the past 240 hour(s))  Respiratory Panel by RT PCR (Flu A&B, Covid) - Nasopharyngeal Swab     Status: [facility]   Collection Time: 03/01/20  6:40 PM   Specimen: Nasopharyngeal Swab  Result Value Ref Range Status   SARS Coronavirus 2 by RT PCR NEGATIVE NEGATIVE Final    Comment: (NOTE) SARS-CoV-2 target nucleic acids are NOT DETECTED.  The SARS-CoV-2 RNA is generally detectable in upper respiratoy specimens during the acute phase of infection. The lowest concentration of SARS-CoV-2 viral copies this assay can detect is 131 copies/mL. A negative result does not preclude SARS-Cov-2 infection and should not be used as the sole basis for treatment or other patient management decisions. A negative result may occur with  improper specimen collection/handling, submission of specimen other than nasopharyngeal swab, presence of viral mutation(s) within the areas targeted by this assay, and inadequate number of viral copies (<131 copies/mL). A negative result must be combined with clinical observations, patient history, and epidemiological information. The expected result is Negative.  Fact Sheet for Patients:  PinkCheek.be  Fact Sheet for Healthcare Providers:  GravelBags.it  This test is no t yet approved or cleared by the [place] FDA and  has been authorized for detection and/or diagnosis of SARS-CoV-2 by FDA under an Emergency Use Authorization (EUA). This EUA will remain  in effect (meaning this test can be used) for the duration of the COVID-19 declaration under Section 564(b)(1) of the Act, 21 U.S.C. section 360bbb-3(b)(1), unless  the authorization is terminated or revoked sooner.     Influenza A by PCR NEGATIVE NEGATIVE Final   Influenza B by PCR NEGATIVE NEGATIVE Final    Comment: (NOTE) The Xpert Xpress SARS-CoV-2/FLU/RSV assay is intended as an aid in  the diagnosis of influenza from Nasopharyngeal swab specimens and  should not be used as a sole basis for treatment. Nasal washings and  aspirates are unacceptable for Xpert Xpress SARS-CoV-2/FLU/RSV  testing.  Fact Sheet for Patients: PinkCheek.be  Fact Sheet for Healthcare Providers: GravelBags.it  This test is not yet approved or cleared by the [place] FDA and  has been authorized for detection and/or diagnosis of SARS-CoV-2 by  FDA under an Emergency Use Authorization (EUA). This EUA will remain  in effect (meaning this test can be used) for the duration of the  Covid-19 declaration under Section 564(b)(1) of the Act, 21  U.S.C. section 360bbb-3(b)(1), unless the authorization is  terminated or revoked. Performed at [facility], [place] [address] [place], [place] [address]      Labs: Basic Metabolic Panel: Recent Labs  Lab 03/01/20 1654 03/03/20 0223  NA 138 138  K 4.2 3.4*  CL 105 108  CO2 25 25  GLUCOSE 120* 113*  BUN 21 13  CREATININE 1.33* 1.09*  CALCIUM 9.5 9.3   Liver Function Tests: Recent Labs  Lab 03/01/20 1654  AST 32  ALT 19  ALKPHOS 60  BILITOT 0.7  PROT 5.8*  ALBUMIN 3.5   No results for input(s): LIPASE, AMYLASE in the last 168 hours. No results for input(s): AMMONIA  in the last 168 hours. CBC: Recent Labs  Lab 03/01/20 1654 03/03/20 0223  WBC 5.9 4.7  NEUTROABS 4.3  --   HGB 13.6 11.8*  HCT 43.1 36.6  MCV 98.9 96.6  PLT 208 171   Cardiac Enzymes: No results for input(s): CKTOTAL, CKMB, CKMBINDEX, TROPONINI in the last 168 hours. BNP: BNP (last 3 results) No results for input(s): BNP in the last 8760 hours.  ProBNP (last 3  results) No results for input(s): PROBNP in the last 8760 hours.  CBG: Recent Labs  Lab 03/01/20 1653 03/02/20 0503 03/03/20 0530  GLUCAP 114* 90 89       Signed:  Domenic Polite MD.  [facility] 03/04/2020, 2:43 PM

## 2020-03-04 NOTE — Plan of Care (Signed)

## 2020-03-04 NOTE — Progress Notes (Signed)
DISCHARGE NOTE HOME Marisa Hall to be discharged Home per MD order. Discussed prescriptions and follow up appointments with the patient. Prescriptions given to patient; medication list explained in detail. Patient verbalized understanding.  Skin clean, dry and intact without evidence of skin break down, no evidence of skin tears noted. IV catheter discontinued intact. Site without signs and symptoms of complications. Dressing and pressure applied. Pt denies pain at the site currently. No complaints noted.  Patient free of lines, drains, and wounds.   An After Visit Summary (AVS) was printed and given to the patient. Patient escorted via wheelchair, and discharged home via private auto.  Paulla Fore, RN, BSN

## 2020-03-04 NOTE — Progress Notes (Signed)
Nutrition Brief Note  Patient identified on the Malnutrition Screening Tool (MST) Report  Wt Readings from Last 15 Encounters:  03/03/20 76.1 kg  09/08/19 75.8 kg  03/28/18 77.6 kg   Wt stable.  Body mass index is 28.8 kg/m. Patient meets criteria for overweight based on current BMI.   Current diet order is regular, patient is consuming approximately 79% of meals at this time (provides ~1800 kcals and 70g protein, adequate to meet >90% of pt's minimum estimated needs).   Labs: K+ 3.4 (L) Medications: Vitamin D3, Protonix, Vitamin B12  Reviewed RN assessment, skin intact.   Estimated Nutrition Needs:  Kcal: 1600-1800 kcals Protein: 76-91 grams Fluid: >/=1.6L/d  Pt noted to be expected to d/c today. No nutrition interventions warranted at this time. If nutrition issues arise, please consult RD.   Larkin Ina, MS, RD, LDN RD pager number and weekend/on-call pager number located in [place].

## 2020-03-08 ENCOUNTER — Other Ambulatory Visit: Payer: Self-pay

## 2020-03-08 NOTE — Patient Outreach (Addendum)
[facility]) Care Management  03/08/2020  Marisa Hall 18-Nov-1940 [id number]   EMMI- General Discharge RED ON EMMI ALERT Day # 1 Date:  03/16/20 Red Alert Reason:  Scheduled follow up? No  Outreach attempt: Spoke with DPR [name] due to patient dementia status.  He reports patient is doing well.  Addressed red alert. Patient has follow up appointment with cardiologist on 03-16-20.  He states daughter will be taking her.  Also advised to follow up with PCP as well.  He verbalized understanding.  He declines further nurse follow up and support at this time.    Advised that they would continue to get automated EMMI-GENERAL post discharge calls to assess how they are doing following recent hospitalization and will receive a call from a nurse if any of their responses were abnormal. He voiced understanding and was appreciative of follow up call.  Plan: RN CM will close case.   Jone Baseman, RN, MSN [facility] Care Management Care Management Coordinator Direct Line [phone] Toll Free: [phone]  Fax: [phone]

## 2020-03-10 ENCOUNTER — Telehealth: Payer: Self-pay | Admitting: Cardiology

## 2020-03-10 ENCOUNTER — Other Ambulatory Visit: Payer: Self-pay

## 2020-03-10 DIAGNOSIS — Z9181 History of falling: Secondary | ICD-10-CM | POA: Diagnosis not present

## 2020-03-10 DIAGNOSIS — I10 Essential (primary) hypertension: Secondary | ICD-10-CM | POA: Diagnosis not present

## 2020-03-10 DIAGNOSIS — E785 Hyperlipidemia, unspecified: Secondary | ICD-10-CM | POA: Diagnosis not present

## 2020-03-10 DIAGNOSIS — E041 Nontoxic single thyroid nodule: Secondary | ICD-10-CM | POA: Diagnosis not present

## 2020-03-10 DIAGNOSIS — I700 Atherosclerosis of aorta: Secondary | ICD-10-CM | POA: Diagnosis not present

## 2020-03-10 DIAGNOSIS — I951 Orthostatic hypotension: Secondary | ICD-10-CM | POA: Diagnosis not present

## 2020-03-10 DIAGNOSIS — F0390 Unspecified dementia without behavioral disturbance: Secondary | ICD-10-CM | POA: Diagnosis not present

## 2020-03-10 NOTE — Patient Outreach (Signed)
[facility]) Care Management  03/10/2020  Marisa Hall [birth year] [id number]   EMMI- General Discharge RED ON EMMI ALERT Day # 4 Date: 03/09/20 Red Alert Reason:  Scheduled follow-up?   No    Red alert previously addressed on 03/08/20.  Patient has follow already scheduled and spouse knows about the follow up.  No need for new outreach.     Plan: RN CM will close case.   Jone Baseman, RN, MSN [facility] Care Management Care Management Coordinator Direct Line [phone] Toll Free: [phone]  Fax: [phone]

## 2020-03-10 NOTE — Telephone Encounter (Signed)
Spoke with Fraser Din who states that the pt was recently in the hospital for orthostatic hypotension. Jeneen Rinks states that the pt's medications have been changes. Fraser Din is concerned that the pt's BP is high and wanted to advise [place]. Fraser Din is requesting nursing see pt in home due to elevated BP. Pt has an appt with Dr. Geraldo Pitter 03/16/20. How do you advise. Pt has dementia and unable to assess if she is symptomatic with BP.

## 2020-03-10 NOTE — Telephone Encounter (Signed)
Marisa Hall (per [facility]) to keep a record of the pts BP for appointment. Marisa Hall verbalized understanding and had no additional question.

## 2020-03-10 NOTE — Telephone Encounter (Signed)
Pt c/o BP issue: STAT if pt c/o blurred vision, one-sided weakness or slurred speech  1. What are your last 5 BP readings?  Today Fraser Din stated that pt BP is running high  Resting 150's/90 After walking 180's/100  2. Are you having any other symptoms (ex. Dizziness, headache, blurred vision, passed out)?   3. What is your BP issue? Pat from [place] called and stated she seen pt today and her bp was running high and wanted to make Dr Geraldo Pitter aware.  Pt has an appt next with Dr Geraldo Pitter     Pats number [phone]

## 2020-03-11 NOTE — Telephone Encounter (Signed)
Please keep a record of her blood pressure.  And bring it to [place] within the next week or 2 so we can look at then follow-up as scheduled

## 2020-03-16 ENCOUNTER — Other Ambulatory Visit: Payer: Self-pay

## 2020-03-16 ENCOUNTER — Ambulatory Visit (INDEPENDENT_AMBULATORY_CARE_PROVIDER_SITE_OTHER): Payer: Medicare HMO | Admitting: Cardiology

## 2020-03-16 ENCOUNTER — Encounter: Payer: Self-pay | Admitting: Cardiology

## 2020-03-16 VITALS — Ht 65.0 in | Wt 156.0 lb

## 2020-03-16 DIAGNOSIS — F0390 Unspecified dementia without behavioral disturbance: Secondary | ICD-10-CM | POA: Insufficient documentation

## 2020-03-16 DIAGNOSIS — I10 Essential (primary) hypertension: Secondary | ICD-10-CM | POA: Insufficient documentation

## 2020-03-16 DIAGNOSIS — I77810 Thoracic aortic ectasia: Secondary | ICD-10-CM | POA: Diagnosis not present

## 2020-03-16 DIAGNOSIS — F0150 Vascular dementia without behavioral disturbance: Secondary | ICD-10-CM

## 2020-03-16 DIAGNOSIS — I951 Orthostatic hypotension: Secondary | ICD-10-CM

## 2020-03-16 DIAGNOSIS — Z89619 Acquired absence of unspecified leg above knee: Secondary | ICD-10-CM | POA: Diagnosis not present

## 2020-03-16 HISTORY — DX: Unspecified dementia, unspecified severity, without behavioral disturbance, psychotic disturbance, mood disturbance, and anxiety: F03.90

## 2020-03-16 HISTORY — DX: Thoracic aortic ectasia: I77.810

## 2020-03-16 HISTORY — DX: Essential (primary) hypertension: I10

## 2020-03-16 NOTE — Progress Notes (Signed)
Cardiology Office Note:    Date:  03/16/2020   ID:  Marisa Hall, DOB 11-12-1940, MRN [id number]  PCP:  Marisa Right, MD  Cardiologist:  Marisa Lindau, MD   Referring MD: Marisa Right, MD    ASSESSMENT:    1. Orthostatic hypotension   2. Hypertension, unspecified type   3. Vascular dementia without behavioral disturbance ([place])   4. Ascending aorta dilatation (HCC)    PLAN:    In order of problems listed above:  1. Primary prevention stressed with the patient.  Importance of compliance with diet medication stressed and she vocalized understanding.  Her husband had multiple questions which were answered to her satisfaction. 2. Essential hypertension: Blood pressure stable.  I will try not to treat it aggressively because of fear of significant orthostasis and fall. 3. Ascending aortic dilatation: Stable.  Report discussed with her.  My nurse will call the doctor's office to discuss test report with their nurse and send them a copy of this report.  There is abnormal tissue structure which needs to be evaluated.  This will be handled and addressed by primary care.  Patient and husband understand this completely. 4. Patient will be seen in follow-up appointment in 6 months or earlier if the patient has any concerns.  Patient will have blood work today.   Medication Adjustments/Labs and Tests Ordered: Current medicines are reviewed at length with the patient today.  Concerns regarding medicines are outlined above.  Orders Placed This Encounter  Procedures  . Basic metabolic panel   No orders of the defined types were placed in this encounter.    No chief complaint on file.    History of Present Illness:    Marisa Hall is a 79 y.o. female.  Patient has past medical history of essential hypertension with significant orthostatic hypotension and dementia.  She is here for follow-up.  She denies any problems at this time.  She has significant dementia and husband  accompanies her for this visit and is very supportive.  No chest pain orthopnea or PND.  Husband has multiple blood pressure readings that he has brought from home and they are stable with significant drop in blood pressure on sitting up.  However the patient has had no symptoms of dizziness or fall or any such issues.  Past Medical History:  Diagnosis Date  . Chest discomfort 03/28/2018  . Hyperlipemia   . Hypertension   . Orthostatic hypotension   . Syncope 03/01/2020    Past Surgical History:  Procedure Laterality Date  . APPENDECTOMY    . CHOLECYSTECTOMY    . JOINT REPLACEMENT  2010  . LEG AMPUTATION    . TONSILLECTOMY    . WISDOM TOOTH EXTRACTION      Current Medications: Current Meds  Medication Sig  . benazepril (LOTENSIN) 5 MG tablet Take 1 tablet (5 mg total) by mouth daily.  . busPIRone (BUSPAR) 7.5 MG tablet Take 1 tablet by mouth daily.  . cholecalciferol (VITAMIN D3) 25 MCG (1000 UNIT) tablet Take 1,000 Units by mouth daily.  Marland Kitchen dicyclomine (BENTYL) 10 MG capsule Take 10 mg by mouth 4 (four) times daily as needed for spasms. Stomach pain  . donepezil (ARICEPT) 10 MG tablet Take 1 tablet by mouth at bedtime.   . DULoxetine (CYMBALTA) 60 MG capsule Take 60 mg by mouth daily.  . memantine (NAMENDA) 10 MG tablet Take 1 tablet by mouth 2 (two) times daily.   Marland Kitchen omeprazole (PRILOSEC) 20 MG capsule Take  20 mg by mouth daily.  Marland Kitchen UNABLE TO FIND Rolling walker  . vitamin B-12 (CYANOCOBALAMIN) 100 MCG tablet Take 100 mcg by mouth daily.     Allergies:   Effexor xr [venlafaxine]   Social History   Socioeconomic History  . Marital status: Married    Spouse name: Not on file  . Number of children: Not on file  . Years of education: Not on file  . Highest education level: Not on file  Occupational History  . Not on file  Tobacco Use  . Smoking status: Never Smoker  . Smokeless tobacco: Never Used  Substance and Sexual Activity  . Alcohol use: Not Currently  . Drug use:  Not on file  . Sexual activity: Not on file  Other Topics Concern  . Not on file  Social History Narrative  . Not on file   Social Determinants of Health   Financial Resource Strain:   . Difficulty of Paying Living Expenses: Not on file  Food Insecurity:   . Worried About Charity fundraiser in the Last Year: Not on file  . Ran Out of Food in the Last Year: Not on file  Transportation Needs:   . Lack of Transportation (Medical): Not on file  . Lack of Transportation (Non-Medical): Not on file  Physical Activity:   . Days of Exercise per Week: Not on file  . Minutes of Exercise per Session: Not on file  Stress:   . Feeling of Stress : Not on file  Social Connections:   . Frequency of Communication with Friends and Family: Not on file  . Frequency of Social Gatherings with Friends and Family: Not on file  . Attends Religious Services: Not on file  . Active Member of Clubs or Organizations: Not on file  . Attends Archivist Meetings: Not on file  . Marital Status: Not on file     Family History: The patient's family history includes Colon cancer in her sister; Dementia in her father; Hypertension in her father.  ROS:   Please see the history of present illness.    All other systems reviewed and are negative.  EKGs/Labs/Other Studies Reviewed:    The following studies were reviewed today: I discussed my findings with the patient at length.  CT scan of the chest revealed dilated ascending aorta at 4.1 cm and also a abnormal tissue suggesting of thyroid.   Recent Labs: 09/08/2019: TSH 1.420 03/01/2020: ALT 19 03/03/2020: BUN 13; Creatinine, Ser 1.09; Hemoglobin 11.8; Platelets 171; Potassium 3.4; Sodium 138  Recent Lipid Panel No results found for: CHOL, TRIG, HDL, CHOLHDL, VLDL, LDLCALC, LDLDIRECT  Physical Exam:    VS:  Ht 5\' 5"  (1.651 m)   Wt 156 lb (70.8 kg)   SpO2 95%   BMI 25.96 kg/m     Wt Readings from Last 3 Encounters:  03/16/20 156 lb (70.8  kg)  03/03/20 167 lb 12.3 oz (76.1 kg)  09/08/19 167 lb (75.8 kg)     GEN: Patient is in no acute distress HEENT: Normal NECK: No JVD; No carotid bruits LYMPHATICS: No lymphadenopathy CARDIAC: Hear sounds regular, 2/6 systolic murmur at the apex. RESPIRATORY:  Clear to auscultation without rales, wheezing or rhonchi  ABDOMEN: Soft, non-tender, non-distended MUSCULOSKELETAL:  No edema; No deformity  SKIN: Warm and dry NEUROLOGIC:  Alert and oriented x 3 PSYCHIATRIC:  Normal affect   Signed, Marisa Lindau, MD  03/16/2020 1:48 PM    [facility]

## 2020-03-16 NOTE — Patient Instructions (Signed)
Medication Instructions:  No medication changes. *If you need a refill on your cardiac medications before your next appointment, please call your pharmacy*   Lab Work: Your physician recommends that you have a BMET done today in the office.  If you have labs (blood work) drawn today and your tests are completely normal, you will receive your results only by: MyChart Message (if you have MyChart) OR A paper copy in the mail If you have any lab test that is abnormal or we need to change your treatment, we will call you to review the results.   Testing/Procedures: None ordered   Follow-Up: At [facility], you and your health needs are our priority.  As part of our continuing mission to provide you with exceptional heart care, we have created designated Provider Care Teams.  These Care Teams include your primary Cardiologist (physician) and Advanced Practice Providers (APPs -  Physician Assistants and Nurse Practitioners) who all work together to provide you with the care you need, when you need it.  We recommend signing up for the patient portal called "MyChart".  Sign up information is provided on this After Visit Summary.  MyChart is used to connect with patients for Virtual Visits (Telemedicine).  Patients are able to view lab/test results, encounter notes, upcoming appointments, etc.  Non-urgent messages can be sent to your provider as well.   To learn more about what you can do with MyChart, go to [email or web address].    Your next appointment:   6 month(s)  The format for your next appointment:   In Person  Provider:   Rajan Revankar, MD   Other Instructions NA  

## 2020-03-17 DIAGNOSIS — I951 Orthostatic hypotension: Secondary | ICD-10-CM | POA: Diagnosis not present

## 2020-03-17 DIAGNOSIS — E041 Nontoxic single thyroid nodule: Secondary | ICD-10-CM | POA: Diagnosis not present

## 2020-03-17 DIAGNOSIS — E785 Hyperlipidemia, unspecified: Secondary | ICD-10-CM | POA: Diagnosis not present

## 2020-03-17 DIAGNOSIS — I10 Essential (primary) hypertension: Secondary | ICD-10-CM | POA: Diagnosis not present

## 2020-03-17 DIAGNOSIS — F0390 Unspecified dementia without behavioral disturbance: Secondary | ICD-10-CM | POA: Diagnosis not present

## 2020-03-17 DIAGNOSIS — I700 Atherosclerosis of aorta: Secondary | ICD-10-CM | POA: Diagnosis not present

## 2020-03-17 DIAGNOSIS — Z9181 History of falling: Secondary | ICD-10-CM | POA: Diagnosis not present

## 2020-03-17 LAB — BASIC METABOLIC PANEL
BUN/Creatinine Ratio: 12 (ref 12–28)
BUN: 14 mg/dL (ref 8–27)
CO2: 27 mmol/L (ref 20–29)
Calcium: 10 mg/dL (ref 8.7–10.3)
Chloride: 103 mmol/L (ref 96–106)
Creatinine, Ser: 1.14 mg/dL — ABNORMAL HIGH (ref 0.57–1.00)
GFR calc Af Amer: 53 mL/min/{1.73_m2} — ABNORMAL LOW (ref >59)
GFR calc non Af Amer: 46 mL/min/{1.73_m2} — ABNORMAL LOW (ref >59)
Glucose: 88 mg/dL (ref 65–99)
Potassium: 4.3 mmol/L (ref 3.5–5.2)
Sodium: 141 mmol/L (ref 134–144)

## 2020-03-18 DIAGNOSIS — D440 Neoplasm of uncertain behavior of thyroid gland: Secondary | ICD-10-CM | POA: Diagnosis not present

## 2020-03-18 DIAGNOSIS — I951 Orthostatic hypotension: Secondary | ICD-10-CM | POA: Diagnosis not present

## 2020-03-18 DIAGNOSIS — Z6826 Body mass index (BMI) 26.0-26.9, adult: Secondary | ICD-10-CM | POA: Diagnosis not present

## 2020-03-18 DIAGNOSIS — F0390 Unspecified dementia without behavioral disturbance: Secondary | ICD-10-CM | POA: Diagnosis not present

## 2020-03-23 DIAGNOSIS — I700 Atherosclerosis of aorta: Secondary | ICD-10-CM | POA: Diagnosis not present

## 2020-03-23 DIAGNOSIS — I10 Essential (primary) hypertension: Secondary | ICD-10-CM | POA: Diagnosis not present

## 2020-03-23 DIAGNOSIS — E785 Hyperlipidemia, unspecified: Secondary | ICD-10-CM | POA: Diagnosis not present

## 2020-03-23 DIAGNOSIS — I951 Orthostatic hypotension: Secondary | ICD-10-CM | POA: Diagnosis not present

## 2020-03-23 DIAGNOSIS — Z9181 History of falling: Secondary | ICD-10-CM | POA: Diagnosis not present

## 2020-03-23 DIAGNOSIS — E041 Nontoxic single thyroid nodule: Secondary | ICD-10-CM | POA: Diagnosis not present

## 2020-03-23 DIAGNOSIS — F0390 Unspecified dementia without behavioral disturbance: Secondary | ICD-10-CM | POA: Diagnosis not present

## 2020-04-02 DIAGNOSIS — Z9181 History of falling: Secondary | ICD-10-CM | POA: Diagnosis not present

## 2020-04-02 DIAGNOSIS — F0390 Unspecified dementia without behavioral disturbance: Secondary | ICD-10-CM | POA: Diagnosis not present

## 2020-04-02 DIAGNOSIS — E785 Hyperlipidemia, unspecified: Secondary | ICD-10-CM | POA: Diagnosis not present

## 2020-04-02 DIAGNOSIS — I10 Essential (primary) hypertension: Secondary | ICD-10-CM | POA: Diagnosis not present

## 2020-04-02 DIAGNOSIS — E041 Nontoxic single thyroid nodule: Secondary | ICD-10-CM | POA: Diagnosis not present

## 2020-04-02 DIAGNOSIS — I951 Orthostatic hypotension: Secondary | ICD-10-CM | POA: Diagnosis not present

## 2020-04-02 DIAGNOSIS — I700 Atherosclerosis of aorta: Secondary | ICD-10-CM | POA: Diagnosis not present

## 2020-04-05 DIAGNOSIS — Z79899 Other long term (current) drug therapy: Secondary | ICD-10-CM | POA: Diagnosis not present

## 2020-04-05 DIAGNOSIS — I951 Orthostatic hypotension: Secondary | ICD-10-CM | POA: Diagnosis not present

## 2020-04-05 DIAGNOSIS — Z0000 Encounter for general adult medical examination without abnormal findings: Secondary | ICD-10-CM | POA: Diagnosis not present

## 2020-04-05 DIAGNOSIS — F0390 Unspecified dementia without behavioral disturbance: Secondary | ICD-10-CM | POA: Diagnosis not present

## 2020-04-05 DIAGNOSIS — D440 Neoplasm of uncertain behavior of thyroid gland: Secondary | ICD-10-CM | POA: Diagnosis not present

## 2020-04-05 DIAGNOSIS — M810 Age-related osteoporosis without current pathological fracture: Secondary | ICD-10-CM | POA: Diagnosis not present

## 2020-04-05 DIAGNOSIS — I10 Essential (primary) hypertension: Secondary | ICD-10-CM | POA: Diagnosis not present

## 2020-04-05 DIAGNOSIS — Z6827 Body mass index (BMI) 27.0-27.9, adult: Secondary | ICD-10-CM | POA: Diagnosis not present

## 2020-04-05 DIAGNOSIS — K219 Gastro-esophageal reflux disease without esophagitis: Secondary | ICD-10-CM | POA: Diagnosis not present

## 2020-04-07 DIAGNOSIS — H53482 Generalized contraction of visual field, left eye: Secondary | ICD-10-CM | POA: Diagnosis not present

## 2020-04-07 DIAGNOSIS — H53483 Generalized contraction of visual field, bilateral: Secondary | ICD-10-CM | POA: Diagnosis not present

## 2020-04-07 DIAGNOSIS — H53481 Generalized contraction of visual field, right eye: Secondary | ICD-10-CM | POA: Diagnosis not present

## 2020-04-08 DIAGNOSIS — F0390 Unspecified dementia without behavioral disturbance: Secondary | ICD-10-CM | POA: Diagnosis not present

## 2020-04-08 DIAGNOSIS — I951 Orthostatic hypotension: Secondary | ICD-10-CM | POA: Diagnosis not present

## 2020-04-08 DIAGNOSIS — E785 Hyperlipidemia, unspecified: Secondary | ICD-10-CM | POA: Diagnosis not present

## 2020-04-08 DIAGNOSIS — I10 Essential (primary) hypertension: Secondary | ICD-10-CM | POA: Diagnosis not present

## 2020-04-08 DIAGNOSIS — Z9181 History of falling: Secondary | ICD-10-CM | POA: Diagnosis not present

## 2020-04-08 DIAGNOSIS — I700 Atherosclerosis of aorta: Secondary | ICD-10-CM | POA: Diagnosis not present

## 2020-04-08 DIAGNOSIS — E041 Nontoxic single thyroid nodule: Secondary | ICD-10-CM | POA: Diagnosis not present

## 2020-04-09 DIAGNOSIS — I10 Essential (primary) hypertension: Secondary | ICD-10-CM | POA: Diagnosis not present

## 2020-04-09 DIAGNOSIS — Z9181 History of falling: Secondary | ICD-10-CM | POA: Diagnosis not present

## 2020-04-09 DIAGNOSIS — I951 Orthostatic hypotension: Secondary | ICD-10-CM | POA: Diagnosis not present

## 2020-04-09 DIAGNOSIS — E041 Nontoxic single thyroid nodule: Secondary | ICD-10-CM | POA: Diagnosis not present

## 2020-04-09 DIAGNOSIS — E785 Hyperlipidemia, unspecified: Secondary | ICD-10-CM | POA: Diagnosis not present

## 2020-04-09 DIAGNOSIS — I700 Atherosclerosis of aorta: Secondary | ICD-10-CM | POA: Diagnosis not present

## 2020-04-09 DIAGNOSIS — F0390 Unspecified dementia without behavioral disturbance: Secondary | ICD-10-CM | POA: Diagnosis not present

## 2020-04-13 DIAGNOSIS — E785 Hyperlipidemia, unspecified: Secondary | ICD-10-CM | POA: Diagnosis not present

## 2020-04-13 DIAGNOSIS — F0390 Unspecified dementia without behavioral disturbance: Secondary | ICD-10-CM | POA: Diagnosis not present

## 2020-04-13 DIAGNOSIS — I700 Atherosclerosis of aorta: Secondary | ICD-10-CM | POA: Diagnosis not present

## 2020-04-13 DIAGNOSIS — E041 Nontoxic single thyroid nodule: Secondary | ICD-10-CM | POA: Diagnosis not present

## 2020-04-13 DIAGNOSIS — Z9181 History of falling: Secondary | ICD-10-CM | POA: Diagnosis not present

## 2020-04-13 DIAGNOSIS — I951 Orthostatic hypotension: Secondary | ICD-10-CM | POA: Diagnosis not present

## 2020-04-13 DIAGNOSIS — I10 Essential (primary) hypertension: Secondary | ICD-10-CM | POA: Diagnosis not present

## 2020-04-21 DIAGNOSIS — N3000 Acute cystitis without hematuria: Secondary | ICD-10-CM | POA: Diagnosis not present

## 2020-04-21 DIAGNOSIS — R300 Dysuria: Secondary | ICD-10-CM | POA: Diagnosis not present

## 2020-04-21 DIAGNOSIS — R55 Syncope and collapse: Secondary | ICD-10-CM | POA: Diagnosis not present

## 2020-04-24 DIAGNOSIS — F0390 Unspecified dementia without behavioral disturbance: Secondary | ICD-10-CM | POA: Diagnosis not present

## 2020-04-24 DIAGNOSIS — E041 Nontoxic single thyroid nodule: Secondary | ICD-10-CM | POA: Diagnosis not present

## 2020-04-24 DIAGNOSIS — I700 Atherosclerosis of aorta: Secondary | ICD-10-CM | POA: Diagnosis not present

## 2020-04-24 DIAGNOSIS — Z9181 History of falling: Secondary | ICD-10-CM | POA: Diagnosis not present

## 2020-04-24 DIAGNOSIS — I10 Essential (primary) hypertension: Secondary | ICD-10-CM | POA: Diagnosis not present

## 2020-04-24 DIAGNOSIS — E785 Hyperlipidemia, unspecified: Secondary | ICD-10-CM | POA: Diagnosis not present

## 2020-04-24 DIAGNOSIS — I951 Orthostatic hypotension: Secondary | ICD-10-CM | POA: Diagnosis not present

## 2020-05-05 DIAGNOSIS — Z9181 History of falling: Secondary | ICD-10-CM | POA: Diagnosis not present

## 2020-05-05 DIAGNOSIS — F0390 Unspecified dementia without behavioral disturbance: Secondary | ICD-10-CM | POA: Diagnosis not present

## 2020-05-05 DIAGNOSIS — I700 Atherosclerosis of aorta: Secondary | ICD-10-CM | POA: Diagnosis not present

## 2020-05-05 DIAGNOSIS — I10 Essential (primary) hypertension: Secondary | ICD-10-CM | POA: Diagnosis not present

## 2020-05-05 DIAGNOSIS — I951 Orthostatic hypotension: Secondary | ICD-10-CM | POA: Diagnosis not present

## 2020-05-05 DIAGNOSIS — E041 Nontoxic single thyroid nodule: Secondary | ICD-10-CM | POA: Diagnosis not present

## 2020-05-05 DIAGNOSIS — E785 Hyperlipidemia, unspecified: Secondary | ICD-10-CM | POA: Diagnosis not present

## 2020-06-14 DIAGNOSIS — I951 Orthostatic hypotension: Secondary | ICD-10-CM | POA: Diagnosis not present

## 2020-06-14 DIAGNOSIS — F0390 Unspecified dementia without behavioral disturbance: Secondary | ICD-10-CM | POA: Diagnosis not present

## 2020-06-14 DIAGNOSIS — Z79899 Other long term (current) drug therapy: Secondary | ICD-10-CM | POA: Diagnosis not present

## 2020-06-14 DIAGNOSIS — I10 Essential (primary) hypertension: Secondary | ICD-10-CM | POA: Diagnosis not present

## 2020-06-14 DIAGNOSIS — D440 Neoplasm of uncertain behavior of thyroid gland: Secondary | ICD-10-CM | POA: Diagnosis not present

## 2020-06-14 DIAGNOSIS — F325 Major depressive disorder, single episode, in full remission: Secondary | ICD-10-CM | POA: Diagnosis not present

## 2020-06-14 DIAGNOSIS — Z6824 Body mass index (BMI) 24.0-24.9, adult: Secondary | ICD-10-CM | POA: Diagnosis not present

## 2020-06-15 DIAGNOSIS — N3000 Acute cystitis without hematuria: Secondary | ICD-10-CM | POA: Diagnosis not present

## 2020-06-18 DIAGNOSIS — D440 Neoplasm of uncertain behavior of thyroid gland: Secondary | ICD-10-CM | POA: Diagnosis not present

## 2020-06-18 DIAGNOSIS — K589 Irritable bowel syndrome without diarrhea: Secondary | ICD-10-CM | POA: Diagnosis not present

## 2020-06-18 DIAGNOSIS — K219 Gastro-esophageal reflux disease without esophagitis: Secondary | ICD-10-CM | POA: Diagnosis not present

## 2020-06-18 DIAGNOSIS — F0390 Unspecified dementia without behavioral disturbance: Secondary | ICD-10-CM | POA: Diagnosis not present

## 2020-06-18 DIAGNOSIS — I10 Essential (primary) hypertension: Secondary | ICD-10-CM | POA: Diagnosis not present

## 2020-06-18 DIAGNOSIS — M19042 Primary osteoarthritis, left hand: Secondary | ICD-10-CM | POA: Diagnosis not present

## 2020-06-18 DIAGNOSIS — M19041 Primary osteoarthritis, right hand: Secondary | ICD-10-CM | POA: Diagnosis not present

## 2020-06-18 DIAGNOSIS — M1712 Unilateral primary osteoarthritis, left knee: Secondary | ICD-10-CM | POA: Diagnosis not present

## 2020-06-18 DIAGNOSIS — I951 Orthostatic hypotension: Secondary | ICD-10-CM | POA: Diagnosis not present

## 2020-06-22 DIAGNOSIS — I951 Orthostatic hypotension: Secondary | ICD-10-CM | POA: Diagnosis not present

## 2020-06-22 DIAGNOSIS — K589 Irritable bowel syndrome without diarrhea: Secondary | ICD-10-CM | POA: Diagnosis not present

## 2020-06-22 DIAGNOSIS — K219 Gastro-esophageal reflux disease without esophagitis: Secondary | ICD-10-CM | POA: Diagnosis not present

## 2020-06-22 DIAGNOSIS — M1712 Unilateral primary osteoarthritis, left knee: Secondary | ICD-10-CM | POA: Diagnosis not present

## 2020-06-22 DIAGNOSIS — M19041 Primary osteoarthritis, right hand: Secondary | ICD-10-CM | POA: Diagnosis not present

## 2020-06-22 DIAGNOSIS — D440 Neoplasm of uncertain behavior of thyroid gland: Secondary | ICD-10-CM | POA: Diagnosis not present

## 2020-06-22 DIAGNOSIS — I10 Essential (primary) hypertension: Secondary | ICD-10-CM | POA: Diagnosis not present

## 2020-06-22 DIAGNOSIS — F0390 Unspecified dementia without behavioral disturbance: Secondary | ICD-10-CM | POA: Diagnosis not present

## 2020-06-22 DIAGNOSIS — M19042 Primary osteoarthritis, left hand: Secondary | ICD-10-CM | POA: Diagnosis not present

## 2020-06-23 ENCOUNTER — Other Ambulatory Visit: Payer: Self-pay

## 2020-06-23 DIAGNOSIS — I10 Essential (primary) hypertension: Secondary | ICD-10-CM | POA: Diagnosis not present

## 2020-06-23 DIAGNOSIS — M1712 Unilateral primary osteoarthritis, left knee: Secondary | ICD-10-CM | POA: Diagnosis not present

## 2020-06-23 DIAGNOSIS — D440 Neoplasm of uncertain behavior of thyroid gland: Secondary | ICD-10-CM | POA: Diagnosis not present

## 2020-06-23 DIAGNOSIS — K589 Irritable bowel syndrome without diarrhea: Secondary | ICD-10-CM | POA: Diagnosis not present

## 2020-06-23 DIAGNOSIS — M19041 Primary osteoarthritis, right hand: Secondary | ICD-10-CM | POA: Diagnosis not present

## 2020-06-23 DIAGNOSIS — I951 Orthostatic hypotension: Secondary | ICD-10-CM | POA: Diagnosis not present

## 2020-06-23 DIAGNOSIS — K219 Gastro-esophageal reflux disease without esophagitis: Secondary | ICD-10-CM | POA: Diagnosis not present

## 2020-06-23 DIAGNOSIS — M19042 Primary osteoarthritis, left hand: Secondary | ICD-10-CM | POA: Diagnosis not present

## 2020-06-23 DIAGNOSIS — F0390 Unspecified dementia without behavioral disturbance: Secondary | ICD-10-CM | POA: Diagnosis not present

## 2020-06-25 DIAGNOSIS — D440 Neoplasm of uncertain behavior of thyroid gland: Secondary | ICD-10-CM | POA: Diagnosis not present

## 2020-06-25 DIAGNOSIS — I951 Orthostatic hypotension: Secondary | ICD-10-CM | POA: Diagnosis not present

## 2020-06-25 DIAGNOSIS — K589 Irritable bowel syndrome without diarrhea: Secondary | ICD-10-CM | POA: Diagnosis not present

## 2020-06-25 DIAGNOSIS — F0390 Unspecified dementia without behavioral disturbance: Secondary | ICD-10-CM | POA: Diagnosis not present

## 2020-06-25 DIAGNOSIS — I10 Essential (primary) hypertension: Secondary | ICD-10-CM | POA: Diagnosis not present

## 2020-06-25 DIAGNOSIS — M1712 Unilateral primary osteoarthritis, left knee: Secondary | ICD-10-CM | POA: Diagnosis not present

## 2020-06-25 DIAGNOSIS — M19042 Primary osteoarthritis, left hand: Secondary | ICD-10-CM | POA: Diagnosis not present

## 2020-06-25 DIAGNOSIS — K219 Gastro-esophageal reflux disease without esophagitis: Secondary | ICD-10-CM | POA: Diagnosis not present

## 2020-06-25 DIAGNOSIS — M19041 Primary osteoarthritis, right hand: Secondary | ICD-10-CM | POA: Diagnosis not present

## 2020-06-29 ENCOUNTER — Ambulatory Visit: Payer: Medicare HMO | Admitting: Cardiology

## 2020-06-30 DIAGNOSIS — M19041 Primary osteoarthritis, right hand: Secondary | ICD-10-CM | POA: Diagnosis not present

## 2020-06-30 DIAGNOSIS — F0390 Unspecified dementia without behavioral disturbance: Secondary | ICD-10-CM | POA: Diagnosis not present

## 2020-06-30 DIAGNOSIS — K219 Gastro-esophageal reflux disease without esophagitis: Secondary | ICD-10-CM | POA: Diagnosis not present

## 2020-06-30 DIAGNOSIS — M19042 Primary osteoarthritis, left hand: Secondary | ICD-10-CM | POA: Diagnosis not present

## 2020-06-30 DIAGNOSIS — I951 Orthostatic hypotension: Secondary | ICD-10-CM | POA: Diagnosis not present

## 2020-06-30 DIAGNOSIS — D440 Neoplasm of uncertain behavior of thyroid gland: Secondary | ICD-10-CM | POA: Diagnosis not present

## 2020-06-30 DIAGNOSIS — M1712 Unilateral primary osteoarthritis, left knee: Secondary | ICD-10-CM | POA: Diagnosis not present

## 2020-06-30 DIAGNOSIS — K589 Irritable bowel syndrome without diarrhea: Secondary | ICD-10-CM | POA: Diagnosis not present

## 2020-06-30 DIAGNOSIS — I10 Essential (primary) hypertension: Secondary | ICD-10-CM | POA: Diagnosis not present

## 2020-07-01 ENCOUNTER — Ambulatory Visit (INDEPENDENT_AMBULATORY_CARE_PROVIDER_SITE_OTHER): Payer: Medicare HMO | Admitting: Cardiology

## 2020-07-01 ENCOUNTER — Encounter: Payer: Self-pay | Admitting: Cardiology

## 2020-07-01 ENCOUNTER — Other Ambulatory Visit: Payer: Self-pay

## 2020-07-01 VITALS — BP 156/74 | Ht 65.0 in | Wt 140.0 lb

## 2020-07-01 DIAGNOSIS — E782 Mixed hyperlipidemia: Secondary | ICD-10-CM

## 2020-07-01 DIAGNOSIS — I77810 Thoracic aortic ectasia: Secondary | ICD-10-CM | POA: Diagnosis not present

## 2020-07-01 DIAGNOSIS — I10 Essential (primary) hypertension: Secondary | ICD-10-CM

## 2020-07-01 DIAGNOSIS — F0390 Unspecified dementia without behavioral disturbance: Secondary | ICD-10-CM | POA: Diagnosis not present

## 2020-07-01 DIAGNOSIS — I951 Orthostatic hypotension: Secondary | ICD-10-CM | POA: Diagnosis not present

## 2020-07-01 DIAGNOSIS — K219 Gastro-esophageal reflux disease without esophagitis: Secondary | ICD-10-CM | POA: Diagnosis not present

## 2020-07-01 NOTE — Patient Instructions (Signed)

## 2020-07-01 NOTE — Progress Notes (Signed)
Cardiology Office Note:    Date:  07/01/2020   ID:  Marisa Hall, DOB [birth year], MRN [id number]  PCP:  Marisa Right, MD  Cardiologist:  Jenean Lindau, MD   Referring MD: Marisa Right, MD    ASSESSMENT:    1. Ascending aorta dilatation (HCC)   2. Essential hypertension   3. Mixed hyperlipidemia    PLAN:    In order of problems listed above:  1. Coronary artery calcification on CT scan: Secondary prevention stressed with the patient.  Importance of compliance with diet medication stressed and she vocalized understanding. 2. Essential hypertension: Blood pressure stable.  This is mildly elevated and I will frequent being very aggressive with her blood pressure control because of fear of hypotension she vocalized understanding.  She has had issues with orthostatic hypotension in the past. 3. Mixed dyslipidemia: On statin therapy.  Lipids followed by primary care physician. 4. Ascending aortic dilatation: CT scan reviewed with the patient at length and questions were answered to her and her husband satisfaction.  Medical management at this time.  This abnormal finding on the area of the thyroid.  This was communicated to her primary care provider.  Her husband mentions to me that they have at this discussion about this finding with primary care and the need for has been done. 5. Patient will be seen in follow-up appointment in 6 months or earlier if the patient has any concerns    Medication Adjustments/Labs and Tests Ordered: Current medicines are reviewed at length with the patient today.  Concerns regarding medicines are outlined above.  No orders of the defined types were placed in this encounter.  No orders of the defined types were placed in this encounter.    No chief complaint on file.    History of Present Illness:    Marisa Hall is a 80 y.o. female.  Patient has past medical history of essential hypertension mixed dyslipidemia, dementia and ascending  aortic dilatation.  She denies any problems at this time and takes care of activities of daily living.  Her husband is very supportive and helps her with activities of daily living.  At the time of my evaluation, the patient is alert awake oriented and in no distress.  Past Medical History:  Diagnosis Date  . Ascending aorta dilatation (HCC) 03/16/2020  . Chest discomfort 03/28/2018  . Dementia ([place]) 03/16/2020  . Essential hypertension 03/16/2020  . Hyperlipemia   . Hypertension   . Orthostatic hypotension   . Syncope 03/01/2020    Past Surgical History:  Procedure Laterality Date  . APPENDECTOMY    . CHOLECYSTECTOMY    . JOINT REPLACEMENT  2010  . LEG AMPUTATION    . TONSILLECTOMY    . WISDOM TOOTH EXTRACTION      Current Medications: No outpatient medications have been marked as taking for the 07/01/20 encounter (Office Visit) with Marisa Hall, Reita Cliche, MD.     Allergies:   Effexor xr [venlafaxine]   Social History   Socioeconomic History  . Marital status: Married    Spouse name: Not on file  . Number of children: Not on file  . Years of education: Not on file  . Highest education level: Not on file  Occupational History  . Not on file  Tobacco Use  . Smoking status: Never Smoker  . Smokeless tobacco: Never Used  Substance and Sexual Activity  . Alcohol use: Not Currently  . Drug use: Not on file  . Sexual activity:  Not on file  Other Topics Concern  . Not on file  Social History Narrative  . Not on file   Social Determinants of Health   Financial Resource Strain: Not on file  Food Insecurity: Not on file  Transportation Needs: Not on file  Physical Activity: Not on file  Stress: Not on file  Social Connections: Not on file     Family History: The patient's family history includes Colon cancer in her sister; Dementia in her father; Hypertension in her father.  ROS:   Please see the history of present illness.    All other systems reviewed and are  negative.  EKGs/Labs/Other Studies Reviewed:    The following studies were reviewed today: I discussed the reports of the CT scan done at [place] with the patient at [place]: 09/08/2019: TSH 1.420 03/01/2020: ALT 19 03/03/2020: Hemoglobin 11.8; Platelets 171 03/16/2020: BUN 14; Creatinine, Ser 1.14; Potassium 4.3; Sodium 141  Recent Lipid Panel No results found for: CHOL, TRIG, HDL, CHOLHDL, VLDL, LDLCALC, LDLDIRECT  Physical Exam:    VS:  There were no vitals taken for this visit.    Wt Readings from Last 3 Encounters:  03/16/20 156 lb (70.8 kg)  03/03/20 167 lb 12.3 oz (76.1 kg)  09/08/19 167 lb (75.8 kg)     GEN: Patient is in no acute distress HEENT: Normal NECK: No JVD; No carotid bruits LYMPHATICS: No lymphadenopathy CARDIAC: Hear sounds regular, 2/6 systolic murmur at the apex. RESPIRATORY:  Clear to auscultation without rales, wheezing or rhonchi  ABDOMEN: Soft, non-tender, non-distended MUSCULOSKELETAL:  No edema; No deformity  SKIN: Warm and dry NEUROLOGIC:  Alert and oriented x 3 PSYCHIATRIC:  Normal affect   Signed, Jenean Lindau, MD  07/01/2020 1:17 PM    [facility]

## 2020-07-02 DIAGNOSIS — M19042 Primary osteoarthritis, left hand: Secondary | ICD-10-CM | POA: Diagnosis not present

## 2020-07-02 DIAGNOSIS — I10 Essential (primary) hypertension: Secondary | ICD-10-CM | POA: Diagnosis not present

## 2020-07-02 DIAGNOSIS — K589 Irritable bowel syndrome without diarrhea: Secondary | ICD-10-CM | POA: Diagnosis not present

## 2020-07-02 DIAGNOSIS — D440 Neoplasm of uncertain behavior of thyroid gland: Secondary | ICD-10-CM | POA: Diagnosis not present

## 2020-07-02 DIAGNOSIS — K219 Gastro-esophageal reflux disease without esophagitis: Secondary | ICD-10-CM | POA: Diagnosis not present

## 2020-07-02 DIAGNOSIS — F0390 Unspecified dementia without behavioral disturbance: Secondary | ICD-10-CM | POA: Diagnosis not present

## 2020-07-02 DIAGNOSIS — M19041 Primary osteoarthritis, right hand: Secondary | ICD-10-CM | POA: Diagnosis not present

## 2020-07-02 DIAGNOSIS — M1712 Unilateral primary osteoarthritis, left knee: Secondary | ICD-10-CM | POA: Diagnosis not present

## 2020-07-02 DIAGNOSIS — I951 Orthostatic hypotension: Secondary | ICD-10-CM | POA: Diagnosis not present

## 2020-07-05 DIAGNOSIS — M19042 Primary osteoarthritis, left hand: Secondary | ICD-10-CM | POA: Diagnosis not present

## 2020-07-05 DIAGNOSIS — I951 Orthostatic hypotension: Secondary | ICD-10-CM | POA: Diagnosis not present

## 2020-07-05 DIAGNOSIS — I10 Essential (primary) hypertension: Secondary | ICD-10-CM | POA: Diagnosis not present

## 2020-07-05 DIAGNOSIS — M19041 Primary osteoarthritis, right hand: Secondary | ICD-10-CM | POA: Diagnosis not present

## 2020-07-05 DIAGNOSIS — M1712 Unilateral primary osteoarthritis, left knee: Secondary | ICD-10-CM | POA: Diagnosis not present

## 2020-07-05 DIAGNOSIS — D440 Neoplasm of uncertain behavior of thyroid gland: Secondary | ICD-10-CM | POA: Diagnosis not present

## 2020-07-05 DIAGNOSIS — K589 Irritable bowel syndrome without diarrhea: Secondary | ICD-10-CM | POA: Diagnosis not present

## 2020-07-05 DIAGNOSIS — F0390 Unspecified dementia without behavioral disturbance: Secondary | ICD-10-CM | POA: Diagnosis not present

## 2020-07-05 DIAGNOSIS — K219 Gastro-esophageal reflux disease without esophagitis: Secondary | ICD-10-CM | POA: Diagnosis not present

## 2020-07-06 ENCOUNTER — Telehealth: Payer: Self-pay | Admitting: Cardiology

## 2020-07-06 DIAGNOSIS — I10 Essential (primary) hypertension: Secondary | ICD-10-CM | POA: Diagnosis not present

## 2020-07-06 DIAGNOSIS — M19042 Primary osteoarthritis, left hand: Secondary | ICD-10-CM | POA: Diagnosis not present

## 2020-07-06 DIAGNOSIS — I951 Orthostatic hypotension: Secondary | ICD-10-CM | POA: Diagnosis not present

## 2020-07-06 DIAGNOSIS — D440 Neoplasm of uncertain behavior of thyroid gland: Secondary | ICD-10-CM | POA: Diagnosis not present

## 2020-07-06 DIAGNOSIS — M19041 Primary osteoarthritis, right hand: Secondary | ICD-10-CM | POA: Diagnosis not present

## 2020-07-06 DIAGNOSIS — K589 Irritable bowel syndrome without diarrhea: Secondary | ICD-10-CM | POA: Diagnosis not present

## 2020-07-06 DIAGNOSIS — M1712 Unilateral primary osteoarthritis, left knee: Secondary | ICD-10-CM | POA: Diagnosis not present

## 2020-07-06 DIAGNOSIS — K219 Gastro-esophageal reflux disease without esophagitis: Secondary | ICD-10-CM | POA: Diagnosis not present

## 2020-07-06 DIAGNOSIS — F0390 Unspecified dementia without behavioral disturbance: Secondary | ICD-10-CM | POA: Diagnosis not present

## 2020-07-06 NOTE — Telephone Encounter (Signed)
Patient's daughter states they were told the patient would have a scan done to test if she had a stroke, but has not heard anything.

## 2020-07-06 NOTE — Telephone Encounter (Signed)
Pt's daughter that she needs to call her PCP for concerns regarding concerns for her mom having a stroke.

## 2020-07-07 DIAGNOSIS — M19041 Primary osteoarthritis, right hand: Secondary | ICD-10-CM | POA: Diagnosis not present

## 2020-07-07 DIAGNOSIS — F0390 Unspecified dementia without behavioral disturbance: Secondary | ICD-10-CM | POA: Diagnosis not present

## 2020-07-07 DIAGNOSIS — M19042 Primary osteoarthritis, left hand: Secondary | ICD-10-CM | POA: Diagnosis not present

## 2020-07-07 DIAGNOSIS — K589 Irritable bowel syndrome without diarrhea: Secondary | ICD-10-CM | POA: Diagnosis not present

## 2020-07-07 DIAGNOSIS — I951 Orthostatic hypotension: Secondary | ICD-10-CM | POA: Diagnosis not present

## 2020-07-07 DIAGNOSIS — D440 Neoplasm of uncertain behavior of thyroid gland: Secondary | ICD-10-CM | POA: Diagnosis not present

## 2020-07-07 DIAGNOSIS — K219 Gastro-esophageal reflux disease without esophagitis: Secondary | ICD-10-CM | POA: Diagnosis not present

## 2020-07-07 DIAGNOSIS — M1712 Unilateral primary osteoarthritis, left knee: Secondary | ICD-10-CM | POA: Diagnosis not present

## 2020-07-07 DIAGNOSIS — I10 Essential (primary) hypertension: Secondary | ICD-10-CM | POA: Diagnosis not present

## 2020-07-09 DIAGNOSIS — M1712 Unilateral primary osteoarthritis, left knee: Secondary | ICD-10-CM | POA: Diagnosis not present

## 2020-07-09 DIAGNOSIS — D440 Neoplasm of uncertain behavior of thyroid gland: Secondary | ICD-10-CM | POA: Diagnosis not present

## 2020-07-09 DIAGNOSIS — K219 Gastro-esophageal reflux disease without esophagitis: Secondary | ICD-10-CM | POA: Diagnosis not present

## 2020-07-09 DIAGNOSIS — M19042 Primary osteoarthritis, left hand: Secondary | ICD-10-CM | POA: Diagnosis not present

## 2020-07-09 DIAGNOSIS — I10 Essential (primary) hypertension: Secondary | ICD-10-CM | POA: Diagnosis not present

## 2020-07-09 DIAGNOSIS — M19041 Primary osteoarthritis, right hand: Secondary | ICD-10-CM | POA: Diagnosis not present

## 2020-07-09 DIAGNOSIS — I951 Orthostatic hypotension: Secondary | ICD-10-CM | POA: Diagnosis not present

## 2020-07-09 DIAGNOSIS — F0390 Unspecified dementia without behavioral disturbance: Secondary | ICD-10-CM | POA: Diagnosis not present

## 2020-07-09 DIAGNOSIS — K589 Irritable bowel syndrome without diarrhea: Secondary | ICD-10-CM | POA: Diagnosis not present

## 2020-07-12 DIAGNOSIS — M1712 Unilateral primary osteoarthritis, left knee: Secondary | ICD-10-CM | POA: Diagnosis not present

## 2020-07-12 DIAGNOSIS — M19042 Primary osteoarthritis, left hand: Secondary | ICD-10-CM | POA: Diagnosis not present

## 2020-07-12 DIAGNOSIS — D440 Neoplasm of uncertain behavior of thyroid gland: Secondary | ICD-10-CM | POA: Diagnosis not present

## 2020-07-12 DIAGNOSIS — F0390 Unspecified dementia without behavioral disturbance: Secondary | ICD-10-CM | POA: Diagnosis not present

## 2020-07-12 DIAGNOSIS — K589 Irritable bowel syndrome without diarrhea: Secondary | ICD-10-CM | POA: Diagnosis not present

## 2020-07-12 DIAGNOSIS — I10 Essential (primary) hypertension: Secondary | ICD-10-CM | POA: Diagnosis not present

## 2020-07-12 DIAGNOSIS — K219 Gastro-esophageal reflux disease without esophagitis: Secondary | ICD-10-CM | POA: Diagnosis not present

## 2020-07-12 DIAGNOSIS — M19041 Primary osteoarthritis, right hand: Secondary | ICD-10-CM | POA: Diagnosis not present

## 2020-07-12 DIAGNOSIS — I951 Orthostatic hypotension: Secondary | ICD-10-CM | POA: Diagnosis not present

## 2020-07-13 DIAGNOSIS — M19041 Primary osteoarthritis, right hand: Secondary | ICD-10-CM | POA: Diagnosis not present

## 2020-07-13 DIAGNOSIS — I951 Orthostatic hypotension: Secondary | ICD-10-CM | POA: Diagnosis not present

## 2020-07-13 DIAGNOSIS — I10 Essential (primary) hypertension: Secondary | ICD-10-CM | POA: Diagnosis not present

## 2020-07-13 DIAGNOSIS — D440 Neoplasm of uncertain behavior of thyroid gland: Secondary | ICD-10-CM | POA: Diagnosis not present

## 2020-07-13 DIAGNOSIS — M1712 Unilateral primary osteoarthritis, left knee: Secondary | ICD-10-CM | POA: Diagnosis not present

## 2020-07-13 DIAGNOSIS — K219 Gastro-esophageal reflux disease without esophagitis: Secondary | ICD-10-CM | POA: Diagnosis not present

## 2020-07-13 DIAGNOSIS — K589 Irritable bowel syndrome without diarrhea: Secondary | ICD-10-CM | POA: Diagnosis not present

## 2020-07-13 DIAGNOSIS — F0390 Unspecified dementia without behavioral disturbance: Secondary | ICD-10-CM | POA: Diagnosis not present

## 2020-07-13 DIAGNOSIS — M19042 Primary osteoarthritis, left hand: Secondary | ICD-10-CM | POA: Diagnosis not present

## 2020-07-14 DIAGNOSIS — D440 Neoplasm of uncertain behavior of thyroid gland: Secondary | ICD-10-CM | POA: Diagnosis not present

## 2020-07-14 DIAGNOSIS — M19041 Primary osteoarthritis, right hand: Secondary | ICD-10-CM | POA: Diagnosis not present

## 2020-07-14 DIAGNOSIS — F0390 Unspecified dementia without behavioral disturbance: Secondary | ICD-10-CM | POA: Diagnosis not present

## 2020-07-14 DIAGNOSIS — I10 Essential (primary) hypertension: Secondary | ICD-10-CM | POA: Diagnosis not present

## 2020-07-14 DIAGNOSIS — K589 Irritable bowel syndrome without diarrhea: Secondary | ICD-10-CM | POA: Diagnosis not present

## 2020-07-14 DIAGNOSIS — M19042 Primary osteoarthritis, left hand: Secondary | ICD-10-CM | POA: Diagnosis not present

## 2020-07-14 DIAGNOSIS — K219 Gastro-esophageal reflux disease without esophagitis: Secondary | ICD-10-CM | POA: Diagnosis not present

## 2020-07-14 DIAGNOSIS — M1712 Unilateral primary osteoarthritis, left knee: Secondary | ICD-10-CM | POA: Diagnosis not present

## 2020-07-14 DIAGNOSIS — I951 Orthostatic hypotension: Secondary | ICD-10-CM | POA: Diagnosis not present

## 2020-07-15 DIAGNOSIS — F325 Major depressive disorder, single episode, in full remission: Secondary | ICD-10-CM | POA: Diagnosis not present

## 2020-07-15 DIAGNOSIS — M19042 Primary osteoarthritis, left hand: Secondary | ICD-10-CM | POA: Diagnosis not present

## 2020-07-15 DIAGNOSIS — I10 Essential (primary) hypertension: Secondary | ICD-10-CM | POA: Diagnosis not present

## 2020-07-15 DIAGNOSIS — K219 Gastro-esophageal reflux disease without esophagitis: Secondary | ICD-10-CM | POA: Diagnosis not present

## 2020-07-15 DIAGNOSIS — D440 Neoplasm of uncertain behavior of thyroid gland: Secondary | ICD-10-CM | POA: Diagnosis not present

## 2020-07-15 DIAGNOSIS — M19041 Primary osteoarthritis, right hand: Secondary | ICD-10-CM | POA: Diagnosis not present

## 2020-07-15 DIAGNOSIS — I951 Orthostatic hypotension: Secondary | ICD-10-CM | POA: Diagnosis not present

## 2020-07-15 DIAGNOSIS — Z6824 Body mass index (BMI) 24.0-24.9, adult: Secondary | ICD-10-CM | POA: Diagnosis not present

## 2020-07-15 DIAGNOSIS — F0390 Unspecified dementia without behavioral disturbance: Secondary | ICD-10-CM | POA: Diagnosis not present

## 2020-07-15 DIAGNOSIS — R350 Frequency of micturition: Secondary | ICD-10-CM | POA: Diagnosis not present

## 2020-07-15 DIAGNOSIS — N3000 Acute cystitis without hematuria: Secondary | ICD-10-CM | POA: Diagnosis not present

## 2020-07-15 DIAGNOSIS — M1712 Unilateral primary osteoarthritis, left knee: Secondary | ICD-10-CM | POA: Diagnosis not present

## 2020-07-15 DIAGNOSIS — K589 Irritable bowel syndrome without diarrhea: Secondary | ICD-10-CM | POA: Diagnosis not present

## 2020-07-16 DIAGNOSIS — F0390 Unspecified dementia without behavioral disturbance: Secondary | ICD-10-CM | POA: Diagnosis not present

## 2020-07-16 DIAGNOSIS — M19042 Primary osteoarthritis, left hand: Secondary | ICD-10-CM | POA: Diagnosis not present

## 2020-07-16 DIAGNOSIS — D440 Neoplasm of uncertain behavior of thyroid gland: Secondary | ICD-10-CM | POA: Diagnosis not present

## 2020-07-16 DIAGNOSIS — K219 Gastro-esophageal reflux disease without esophagitis: Secondary | ICD-10-CM | POA: Diagnosis not present

## 2020-07-16 DIAGNOSIS — M19041 Primary osteoarthritis, right hand: Secondary | ICD-10-CM | POA: Diagnosis not present

## 2020-07-16 DIAGNOSIS — I951 Orthostatic hypotension: Secondary | ICD-10-CM | POA: Diagnosis not present

## 2020-07-16 DIAGNOSIS — I10 Essential (primary) hypertension: Secondary | ICD-10-CM | POA: Diagnosis not present

## 2020-07-16 DIAGNOSIS — K589 Irritable bowel syndrome without diarrhea: Secondary | ICD-10-CM | POA: Diagnosis not present

## 2020-07-16 DIAGNOSIS — M1712 Unilateral primary osteoarthritis, left knee: Secondary | ICD-10-CM | POA: Diagnosis not present

## 2020-07-18 DIAGNOSIS — I10 Essential (primary) hypertension: Secondary | ICD-10-CM | POA: Diagnosis not present

## 2020-07-18 DIAGNOSIS — M19041 Primary osteoarthritis, right hand: Secondary | ICD-10-CM | POA: Diagnosis not present

## 2020-07-18 DIAGNOSIS — M19042 Primary osteoarthritis, left hand: Secondary | ICD-10-CM | POA: Diagnosis not present

## 2020-07-18 DIAGNOSIS — I951 Orthostatic hypotension: Secondary | ICD-10-CM | POA: Diagnosis not present

## 2020-07-18 DIAGNOSIS — M1712 Unilateral primary osteoarthritis, left knee: Secondary | ICD-10-CM | POA: Diagnosis not present

## 2020-07-18 DIAGNOSIS — K589 Irritable bowel syndrome without diarrhea: Secondary | ICD-10-CM | POA: Diagnosis not present

## 2020-07-18 DIAGNOSIS — F0390 Unspecified dementia without behavioral disturbance: Secondary | ICD-10-CM | POA: Diagnosis not present

## 2020-07-18 DIAGNOSIS — K219 Gastro-esophageal reflux disease without esophagitis: Secondary | ICD-10-CM | POA: Diagnosis not present

## 2020-07-18 DIAGNOSIS — D440 Neoplasm of uncertain behavior of thyroid gland: Secondary | ICD-10-CM | POA: Diagnosis not present

## 2020-07-20 DIAGNOSIS — D440 Neoplasm of uncertain behavior of thyroid gland: Secondary | ICD-10-CM | POA: Diagnosis not present

## 2020-07-20 DIAGNOSIS — F0390 Unspecified dementia without behavioral disturbance: Secondary | ICD-10-CM | POA: Diagnosis not present

## 2020-07-20 DIAGNOSIS — M19041 Primary osteoarthritis, right hand: Secondary | ICD-10-CM | POA: Diagnosis not present

## 2020-07-20 DIAGNOSIS — M1712 Unilateral primary osteoarthritis, left knee: Secondary | ICD-10-CM | POA: Diagnosis not present

## 2020-07-20 DIAGNOSIS — I10 Essential (primary) hypertension: Secondary | ICD-10-CM | POA: Diagnosis not present

## 2020-07-20 DIAGNOSIS — K219 Gastro-esophageal reflux disease without esophagitis: Secondary | ICD-10-CM | POA: Diagnosis not present

## 2020-07-20 DIAGNOSIS — M19042 Primary osteoarthritis, left hand: Secondary | ICD-10-CM | POA: Diagnosis not present

## 2020-07-20 DIAGNOSIS — I951 Orthostatic hypotension: Secondary | ICD-10-CM | POA: Diagnosis not present

## 2020-07-20 DIAGNOSIS — K589 Irritable bowel syndrome without diarrhea: Secondary | ICD-10-CM | POA: Diagnosis not present

## 2020-07-21 DIAGNOSIS — K219 Gastro-esophageal reflux disease without esophagitis: Secondary | ICD-10-CM | POA: Diagnosis not present

## 2020-07-21 DIAGNOSIS — I10 Essential (primary) hypertension: Secondary | ICD-10-CM | POA: Diagnosis not present

## 2020-07-21 DIAGNOSIS — M19042 Primary osteoarthritis, left hand: Secondary | ICD-10-CM | POA: Diagnosis not present

## 2020-07-21 DIAGNOSIS — I951 Orthostatic hypotension: Secondary | ICD-10-CM | POA: Diagnosis not present

## 2020-07-21 DIAGNOSIS — M19041 Primary osteoarthritis, right hand: Secondary | ICD-10-CM | POA: Diagnosis not present

## 2020-07-21 DIAGNOSIS — D440 Neoplasm of uncertain behavior of thyroid gland: Secondary | ICD-10-CM | POA: Diagnosis not present

## 2020-07-21 DIAGNOSIS — M1712 Unilateral primary osteoarthritis, left knee: Secondary | ICD-10-CM | POA: Diagnosis not present

## 2020-07-21 DIAGNOSIS — F0390 Unspecified dementia without behavioral disturbance: Secondary | ICD-10-CM | POA: Diagnosis not present

## 2020-07-21 DIAGNOSIS — K589 Irritable bowel syndrome without diarrhea: Secondary | ICD-10-CM | POA: Diagnosis not present

## 2020-07-22 DIAGNOSIS — S065X0A Traumatic subdural hemorrhage without loss of consciousness, initial encounter: Secondary | ICD-10-CM | POA: Diagnosis not present

## 2020-07-22 DIAGNOSIS — R4182 Altered mental status, unspecified: Secondary | ICD-10-CM | POA: Diagnosis not present

## 2020-07-22 DIAGNOSIS — F0280 Dementia in other diseases classified elsewhere without behavioral disturbance: Secondary | ICD-10-CM | POA: Diagnosis not present

## 2020-07-22 DIAGNOSIS — R296 Repeated falls: Secondary | ICD-10-CM | POA: Diagnosis not present

## 2020-07-22 DIAGNOSIS — G9389 Other specified disorders of brain: Secondary | ICD-10-CM | POA: Diagnosis not present

## 2020-07-22 DIAGNOSIS — M25551 Pain in right hip: Secondary | ICD-10-CM | POA: Diagnosis not present

## 2020-07-22 DIAGNOSIS — G309 Alzheimer's disease, unspecified: Secondary | ICD-10-CM | POA: Diagnosis not present

## 2020-07-22 DIAGNOSIS — I10 Essential (primary) hypertension: Secondary | ICD-10-CM | POA: Diagnosis not present

## 2020-07-22 DIAGNOSIS — I160 Hypertensive urgency: Secondary | ICD-10-CM | POA: Diagnosis not present

## 2020-07-22 DIAGNOSIS — F0390 Unspecified dementia without behavioral disturbance: Secondary | ICD-10-CM | POA: Diagnosis not present

## 2020-07-22 DIAGNOSIS — Z9181 History of falling: Secondary | ICD-10-CM | POA: Diagnosis not present

## 2020-07-22 DIAGNOSIS — R279 Unspecified lack of coordination: Secondary | ICD-10-CM | POA: Diagnosis not present

## 2020-07-22 DIAGNOSIS — W19XXXA Unspecified fall, initial encounter: Secondary | ICD-10-CM | POA: Diagnosis not present

## 2020-07-22 DIAGNOSIS — E785 Hyperlipidemia, unspecified: Secondary | ICD-10-CM | POA: Diagnosis not present

## 2020-07-22 DIAGNOSIS — R410 Disorientation, unspecified: Secondary | ICD-10-CM | POA: Diagnosis not present

## 2020-07-22 DIAGNOSIS — Z7401 Bed confinement status: Secondary | ICD-10-CM | POA: Diagnosis not present

## 2020-07-22 DIAGNOSIS — R5381 Other malaise: Secondary | ICD-10-CM | POA: Diagnosis not present

## 2020-07-23 DIAGNOSIS — S065X0A Traumatic subdural hemorrhage without loss of consciousness, initial encounter: Secondary | ICD-10-CM | POA: Diagnosis not present

## 2020-07-26 DIAGNOSIS — M25551 Pain in right hip: Secondary | ICD-10-CM | POA: Diagnosis not present

## 2020-10-02 ENCOUNTER — Other Ambulatory Visit: Payer: Self-pay | Admitting: Cardiology

## 2020-12-29 ENCOUNTER — Other Ambulatory Visit: Payer: Self-pay

## 2020-12-30 ENCOUNTER — Ambulatory Visit: Payer: Medicare HMO | Admitting: Cardiology

## 2021-01-20 DEATH — deceased

## 2022-01-29 IMAGING — CT CT HEAD CODE STROKE
3 of 4 series · 15 of 47 positions shown, 18 images · non-contrast
Comparison: None.

CLINICAL DATA: Code stroke. 79-year-old female with right side
deficits. Recent fall.

EXAM:
CT HEAD WITHOUT CONTRAST
TECHNIQUE: Contiguous axial images were obtained from the base of the skull
through the vertex without intravenous contrast.

[Series 4: head 2.0 h70h · axial · 0.41mm/px · z∈[-100,+28]mm · 9 of 82 slices shown, 12 images]
[im 9/82  brain]
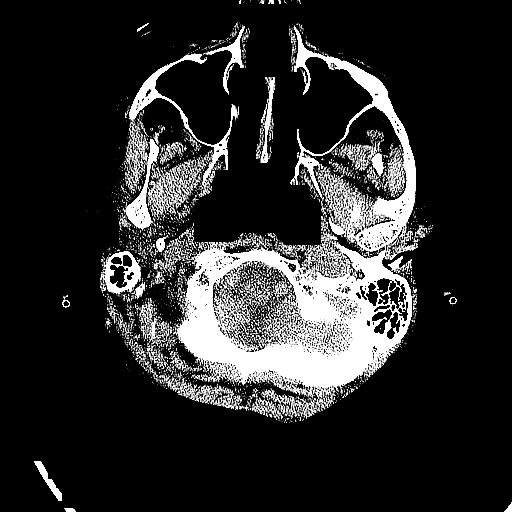
[im 9/82  bone]
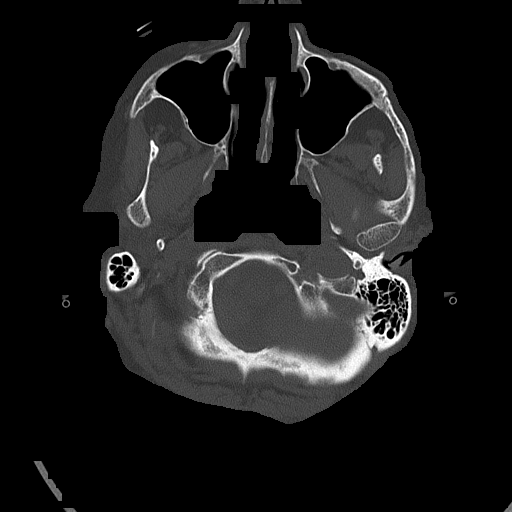
[im 17/82  brain]
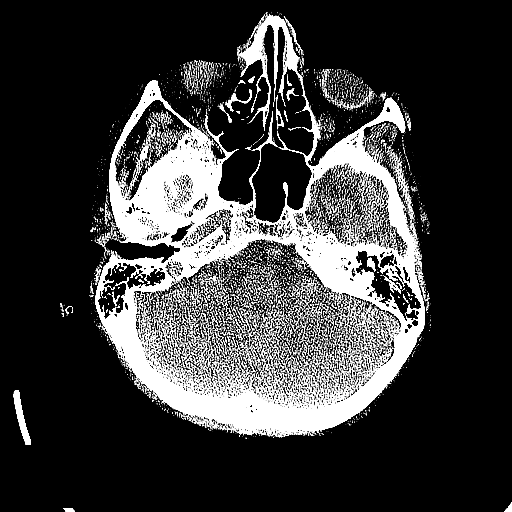
[im 25/82  brain]
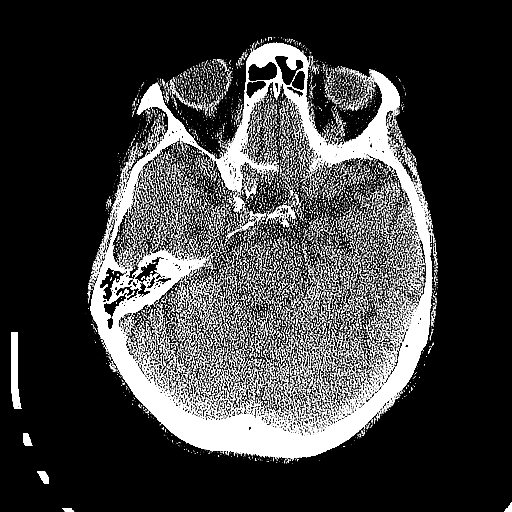
[im 33/82  brain]
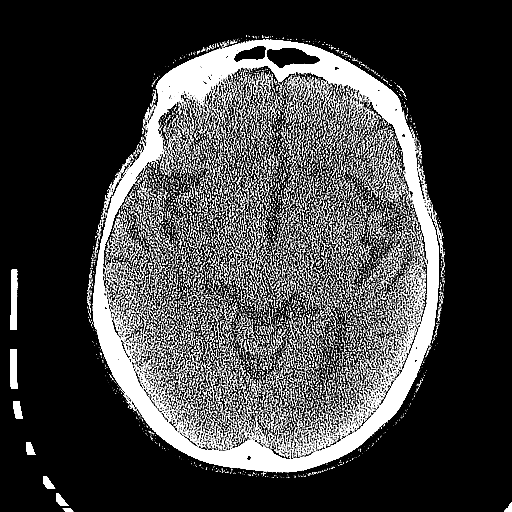
[im 41/82  brain]
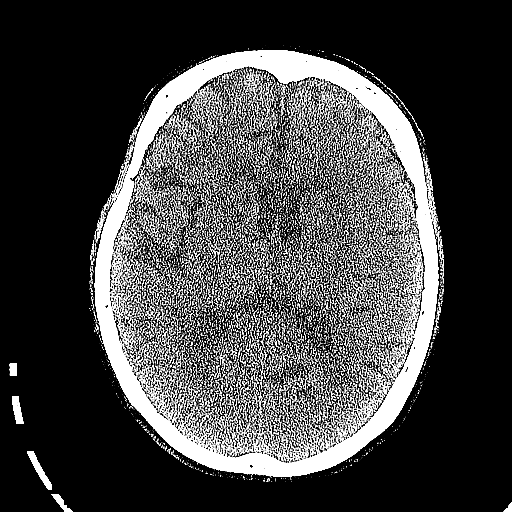
[im 41/82  bone]
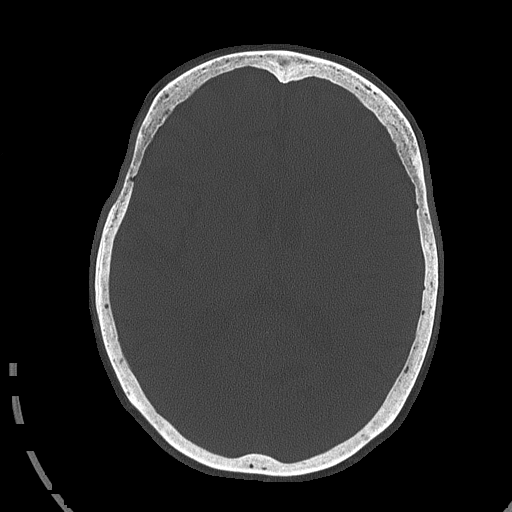
[im 49/82  brain]
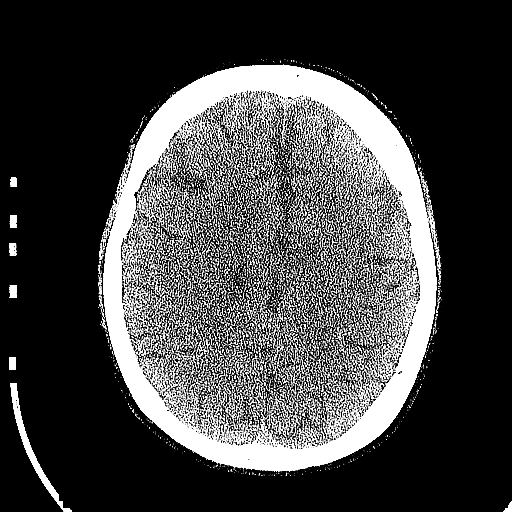
[im 57/82  brain]
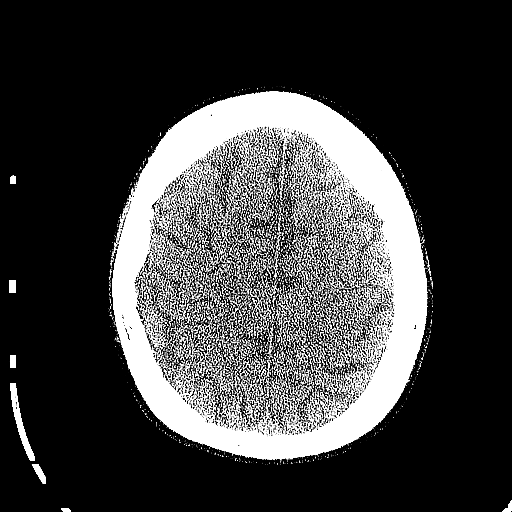
[im 65/82  brain]
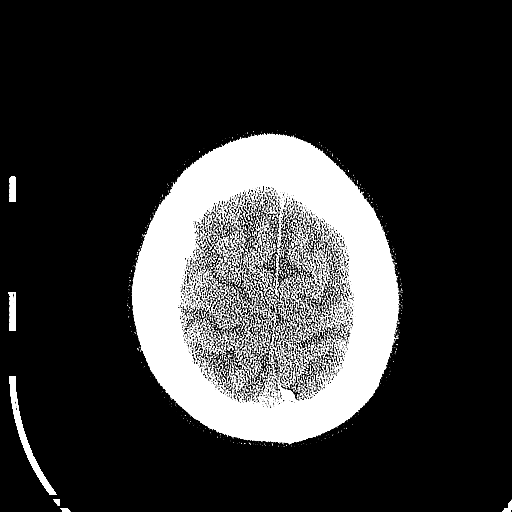
[im 73/82  brain]
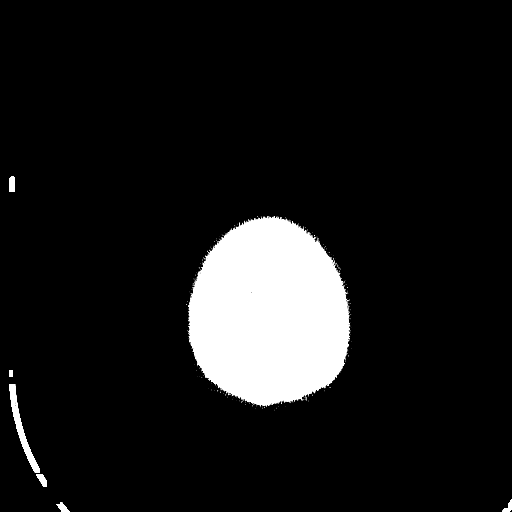
[im 73/82  bone]
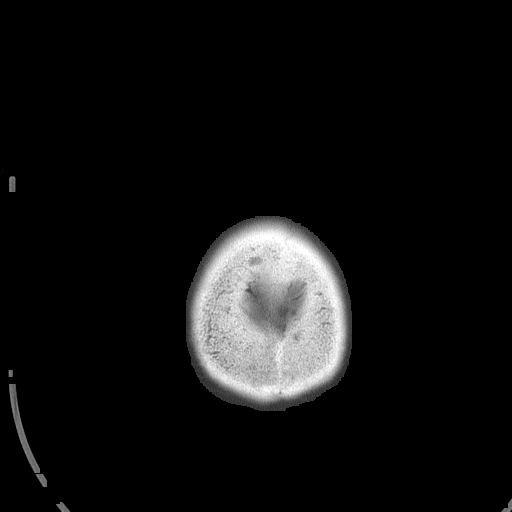

[Series 5: head 3.0 mpr cor · coronal · 0.32mm/px · 3 of 72 slices shown]
[im 24/72  brain]
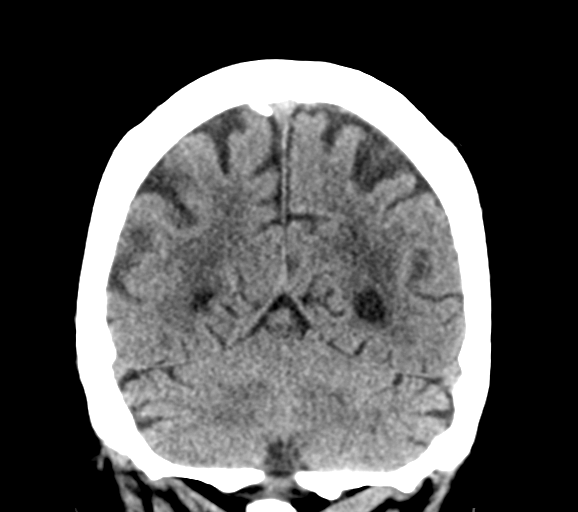
[im 32/72  brain]
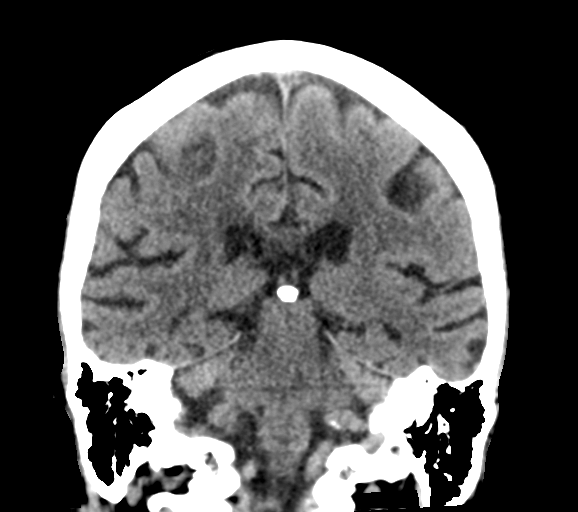
[im 40/72  brain]
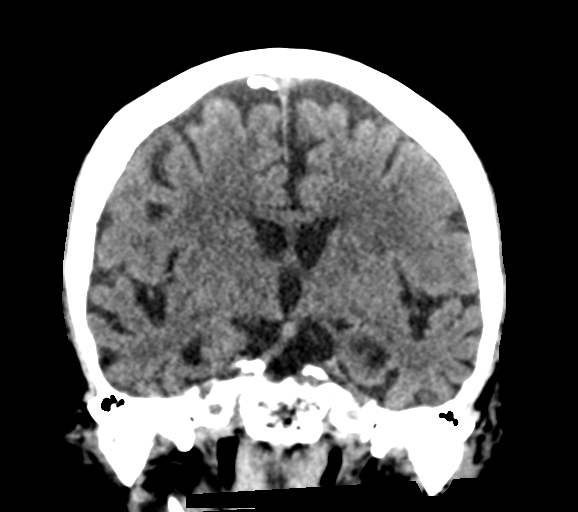

[Series 6: head 3.0 mpr sag · sagittal · 0.32mm/px · 3 of 57 slices shown]
[im 19/57  brain]
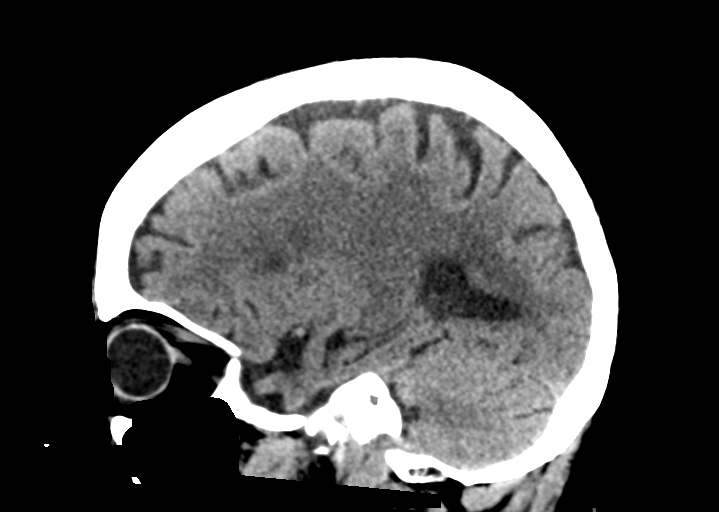
[im 29/57  brain]
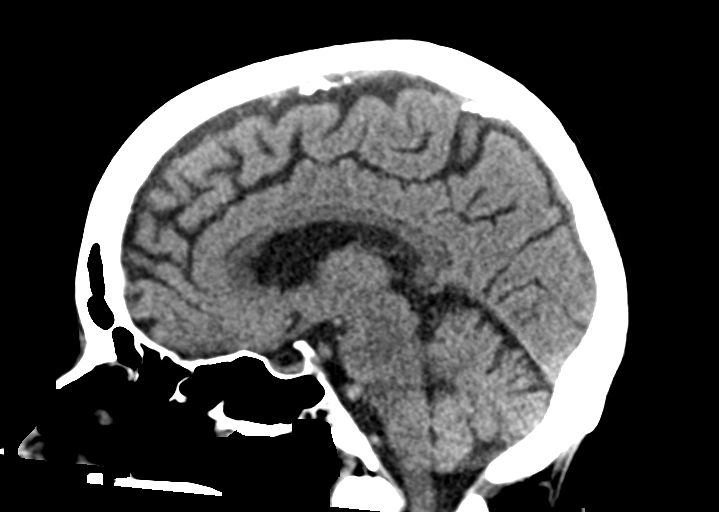
[im 38/57  brain]
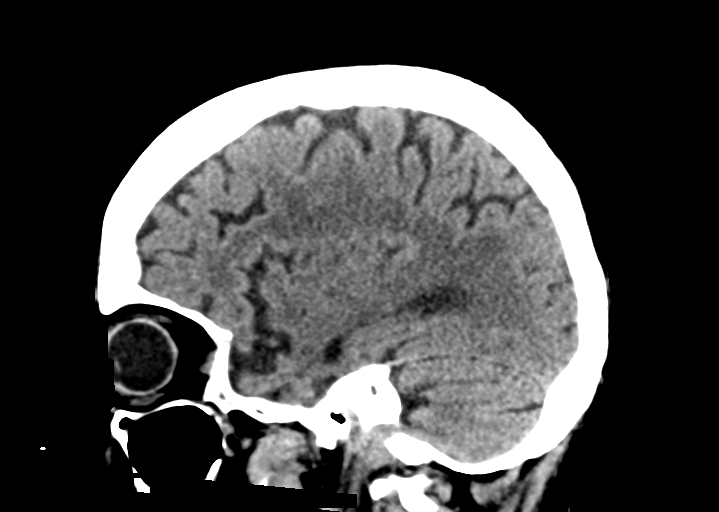

[15 of 47 positions shown; findings below may reference images not displayed]

FINDINGS: Brain: No ventriculomegaly. No midline shift, mass effect, or
evidence of intracranial mass lesion. No acute intracranial
hemorrhage identified.

Fairly extensive bilateral cerebral white matter and deep gray
matter nuclei hypodensity in keeping with chronic small vessel
ischemic disease. No superimposed changes of acute cortically based
infarct identified. No cortical encephalomalacia identified.

Vascular: Calcified atherosclerosis at the skull base. No suspicious
intracranial vascular hyperdensity.

Skull: No acute osseous abnormality identified.

Sinuses/Orbits: Visualized paranasal sinuses and mastoids are clear.

Other: No acute orbit or scalp soft tissue finding.

ASPECTS ([place] Stroke Program Early CT Score)

Total score (0-10 with 10 being normal): 10
IMPRESSION: 1. Evidence of advanced small vessel disease. No acute cortically
based infarct or acute intracranial hemorrhage identified.
2. ASPECTS 10.
3. These results were communicated to Chai Tiger at [DATE] pmon
03/01/2020 by text page via the [employer] messaging system.
